# Patient Record
Sex: Male | Born: 1935 | Race: White | Hispanic: No | Smoking: Former smoker
Health system: Southern US, Community
[De-identification: ages and names within clinical notes are randomized; demographics above are authoritative.]

## PROBLEM LIST (undated history)

## (undated) DIAGNOSIS — M1990 Unspecified osteoarthritis, unspecified site: Secondary | ICD-10-CM

## (undated) HISTORY — PX: PROSTATE SURGERY: SHX751

## (undated) HISTORY — PX: EXPLORATORY LAPAROTOMY: SUR591

## (undated) HISTORY — PX: APPENDECTOMY: SHX54

## (undated) HISTORY — PX: LAMINECTOMY: SHX219

---

## 1994-12-22 DIAGNOSIS — C801 Malignant (primary) neoplasm, unspecified: Secondary | ICD-10-CM

## 1994-12-22 HISTORY — DX: Malignant (primary) neoplasm, unspecified: C80.1

## 2001-07-06 ENCOUNTER — Ambulatory Visit (HOSPITAL_COMMUNITY): Admission: RE | Admit: 2001-07-06 | Discharge: 2001-07-06 | Payer: Self-pay | Admitting: Ophthalmology

## 2004-10-22 ENCOUNTER — Ambulatory Visit (HOSPITAL_COMMUNITY): Admission: RE | Admit: 2004-10-22 | Discharge: 2004-10-22 | Payer: Self-pay | Admitting: Urology

## 2005-10-28 ENCOUNTER — Ambulatory Visit (HOSPITAL_COMMUNITY): Admission: RE | Admit: 2005-10-28 | Discharge: 2005-10-28 | Payer: Self-pay | Admitting: Urology

## 2006-10-01 ENCOUNTER — Ambulatory Visit (HOSPITAL_COMMUNITY): Admission: RE | Admit: 2006-10-01 | Discharge: 2006-10-01 | Payer: Self-pay | Admitting: Urology

## 2008-04-17 ENCOUNTER — Ambulatory Visit: Payer: Self-pay | Admitting: Internal Medicine

## 2008-04-26 ENCOUNTER — Encounter: Payer: Self-pay | Admitting: Internal Medicine

## 2008-04-26 ENCOUNTER — Ambulatory Visit: Payer: Self-pay | Admitting: Internal Medicine

## 2008-05-10 ENCOUNTER — Ambulatory Visit: Payer: Self-pay | Admitting: Internal Medicine

## 2011-01-11 ENCOUNTER — Encounter: Payer: Self-pay | Admitting: Urology

## 2012-04-27 ENCOUNTER — Ambulatory Visit (INDEPENDENT_AMBULATORY_CARE_PROVIDER_SITE_OTHER): Payer: Medicare Other | Admitting: Urology

## 2012-04-27 DIAGNOSIS — C61 Malignant neoplasm of prostate: Secondary | ICD-10-CM

## 2012-04-27 DIAGNOSIS — N393 Stress incontinence (female) (male): Secondary | ICD-10-CM

## 2012-11-16 ENCOUNTER — Ambulatory Visit (INDEPENDENT_AMBULATORY_CARE_PROVIDER_SITE_OTHER): Payer: Medicare Other | Admitting: Urology

## 2012-11-16 DIAGNOSIS — C61 Malignant neoplasm of prostate: Secondary | ICD-10-CM

## 2012-11-16 DIAGNOSIS — N393 Stress incontinence (female) (male): Secondary | ICD-10-CM

## 2013-05-05 ENCOUNTER — Encounter: Payer: Self-pay | Admitting: Internal Medicine

## 2013-05-17 ENCOUNTER — Ambulatory Visit (INDEPENDENT_AMBULATORY_CARE_PROVIDER_SITE_OTHER): Payer: Medicare Other | Admitting: Urology

## 2013-05-17 DIAGNOSIS — N393 Stress incontinence (female) (male): Secondary | ICD-10-CM

## 2013-05-17 DIAGNOSIS — C61 Malignant neoplasm of prostate: Secondary | ICD-10-CM

## 2013-07-05 ENCOUNTER — Ambulatory Visit (INDEPENDENT_AMBULATORY_CARE_PROVIDER_SITE_OTHER): Payer: Self-pay | Admitting: Urology

## 2013-07-05 DIAGNOSIS — N3946 Mixed incontinence: Secondary | ICD-10-CM

## 2013-07-05 DIAGNOSIS — C61 Malignant neoplasm of prostate: Secondary | ICD-10-CM

## 2013-09-06 ENCOUNTER — Ambulatory Visit (INDEPENDENT_AMBULATORY_CARE_PROVIDER_SITE_OTHER): Payer: Medicare Other | Admitting: Urology

## 2013-09-06 DIAGNOSIS — C61 Malignant neoplasm of prostate: Secondary | ICD-10-CM

## 2013-09-06 DIAGNOSIS — N393 Stress incontinence (female) (male): Secondary | ICD-10-CM

## 2014-01-10 ENCOUNTER — Ambulatory Visit (INDEPENDENT_AMBULATORY_CARE_PROVIDER_SITE_OTHER): Payer: Medicare HMO | Admitting: Urology

## 2014-01-10 DIAGNOSIS — C61 Malignant neoplasm of prostate: Secondary | ICD-10-CM

## 2014-01-10 DIAGNOSIS — N393 Stress incontinence (female) (male): Secondary | ICD-10-CM

## 2014-05-16 ENCOUNTER — Ambulatory Visit (INDEPENDENT_AMBULATORY_CARE_PROVIDER_SITE_OTHER): Payer: Medicare HMO | Admitting: Urology

## 2014-05-16 ENCOUNTER — Other Ambulatory Visit: Payer: Self-pay | Admitting: Urology

## 2014-05-16 DIAGNOSIS — N393 Stress incontinence (female) (male): Secondary | ICD-10-CM

## 2014-05-16 DIAGNOSIS — C61 Malignant neoplasm of prostate: Secondary | ICD-10-CM

## 2014-05-22 ENCOUNTER — Encounter (HOSPITAL_COMMUNITY): Payer: Self-pay

## 2014-05-22 ENCOUNTER — Encounter (HOSPITAL_COMMUNITY)
Admission: RE | Admit: 2014-05-22 | Discharge: 2014-05-22 | Disposition: A | Discharge status: Home / Self Care | Payer: Medicare HMO | Source: Ambulatory Visit | Attending: Urology | Admitting: Urology

## 2014-05-22 DIAGNOSIS — C61 Malignant neoplasm of prostate: Secondary | ICD-10-CM

## 2014-05-22 MED ORDER — TECHNETIUM TC 99M MEDRONATE IV KIT
25.0000 | PACK | Freq: Once | INTRAVENOUS | Status: AC | PRN
  Administered 2014-05-22: 25 via INTRAVENOUS

## 2014-05-31 ENCOUNTER — Encounter: Payer: Self-pay | Admitting: Internal Medicine

## 2014-08-29 ENCOUNTER — Ambulatory Visit (INDEPENDENT_AMBULATORY_CARE_PROVIDER_SITE_OTHER): Payer: Medicare HMO | Admitting: Urology

## 2014-08-29 DIAGNOSIS — N393 Stress incontinence (female) (male): Secondary | ICD-10-CM

## 2014-08-29 DIAGNOSIS — C61 Malignant neoplasm of prostate: Secondary | ICD-10-CM

## 2014-12-26 ENCOUNTER — Other Ambulatory Visit: Payer: Self-pay | Admitting: Urology

## 2014-12-26 ENCOUNTER — Ambulatory Visit (INDEPENDENT_AMBULATORY_CARE_PROVIDER_SITE_OTHER): Payer: Medicare HMO | Admitting: Urology

## 2014-12-26 DIAGNOSIS — C61 Malignant neoplasm of prostate: Secondary | ICD-10-CM

## 2015-04-30 ENCOUNTER — Encounter (HOSPITAL_COMMUNITY)
Admission: RE | Admit: 2015-04-30 | Discharge: 2015-04-30 | Disposition: A | Discharge status: Home / Self Care | Payer: Medicare HMO | Source: Ambulatory Visit | Attending: Urology | Admitting: Urology

## 2015-04-30 ENCOUNTER — Encounter (HOSPITAL_COMMUNITY): Payer: Self-pay

## 2015-04-30 ENCOUNTER — Ambulatory Visit (HOSPITAL_COMMUNITY): Admission: RE | Admit: 2015-04-30 | Payer: Medicare HMO | Source: Ambulatory Visit

## 2015-04-30 DIAGNOSIS — C61 Malignant neoplasm of prostate: Secondary | ICD-10-CM

## 2015-04-30 MED ORDER — TECHNETIUM TC 99M MEDRONATE IV KIT: 25.0000 | PACK | Freq: Once | INTRAVENOUS | Status: AC | PRN

## 2015-05-07 ENCOUNTER — Ambulatory Visit (HOSPITAL_COMMUNITY)
Admission: RE | Admit: 2015-05-07 | Discharge: 2015-05-07 | Disposition: A | Discharge status: Home / Self Care | Payer: Medicare HMO | Source: Ambulatory Visit | Attending: Urology | Admitting: Urology

## 2015-05-07 DIAGNOSIS — C61 Malignant neoplasm of prostate: Secondary | ICD-10-CM | POA: Insufficient documentation

## 2015-05-07 LAB — POCT I-STAT CREATININE: Creatinine, Ser: 0.9 mg/dL (ref 0.61–1.24)

## 2015-05-07 MED ORDER — IOHEXOL 300 MG/ML  SOLN
100.0000 mL | Freq: Once | INTRAMUSCULAR | Status: AC | PRN
  Administered 2015-05-07: 100 mL via INTRAVENOUS

## 2015-05-08 ENCOUNTER — Ambulatory Visit (INDEPENDENT_AMBULATORY_CARE_PROVIDER_SITE_OTHER): Payer: Medicare HMO | Admitting: Urology

## 2015-05-08 DIAGNOSIS — N393 Stress incontinence (female) (male): Secondary | ICD-10-CM | POA: Diagnosis not present

## 2015-05-08 DIAGNOSIS — C61 Malignant neoplasm of prostate: Secondary | ICD-10-CM | POA: Diagnosis not present

## 2015-09-11 ENCOUNTER — Ambulatory Visit (INDEPENDENT_AMBULATORY_CARE_PROVIDER_SITE_OTHER): Payer: Medicare HMO | Admitting: Urology

## 2015-09-11 DIAGNOSIS — C61 Malignant neoplasm of prostate: Secondary | ICD-10-CM

## 2016-01-02 DIAGNOSIS — C61 Malignant neoplasm of prostate: Secondary | ICD-10-CM | POA: Diagnosis not present

## 2016-01-08 ENCOUNTER — Ambulatory Visit (INDEPENDENT_AMBULATORY_CARE_PROVIDER_SITE_OTHER): Payer: PPO | Admitting: Urology

## 2016-01-08 DIAGNOSIS — N393 Stress incontinence (female) (male): Secondary | ICD-10-CM

## 2016-01-08 DIAGNOSIS — C61 Malignant neoplasm of prostate: Secondary | ICD-10-CM | POA: Diagnosis not present

## 2016-02-18 DIAGNOSIS — Z0000 Encounter for general adult medical examination without abnormal findings: Secondary | ICD-10-CM | POA: Diagnosis not present

## 2016-02-18 DIAGNOSIS — H6123 Impacted cerumen, bilateral: Secondary | ICD-10-CM | POA: Diagnosis not present

## 2016-02-18 DIAGNOSIS — Z1211 Encounter for screening for malignant neoplasm of colon: Secondary | ICD-10-CM | POA: Diagnosis not present

## 2016-02-18 DIAGNOSIS — Z1389 Encounter for screening for other disorder: Secondary | ICD-10-CM | POA: Diagnosis not present

## 2016-02-18 DIAGNOSIS — Z6831 Body mass index (BMI) 31.0-31.9, adult: Secondary | ICD-10-CM | POA: Diagnosis not present

## 2016-02-18 DIAGNOSIS — Z7189 Other specified counseling: Secondary | ICD-10-CM | POA: Diagnosis not present

## 2016-02-18 DIAGNOSIS — I10 Essential (primary) hypertension: Secondary | ICD-10-CM | POA: Diagnosis not present

## 2016-02-18 DIAGNOSIS — E7800 Pure hypercholesterolemia, unspecified: Secondary | ICD-10-CM | POA: Diagnosis not present

## 2016-02-18 DIAGNOSIS — Z418 Encounter for other procedures for purposes other than remedying health state: Secondary | ICD-10-CM | POA: Diagnosis not present

## 2016-02-18 DIAGNOSIS — R5383 Other fatigue: Secondary | ICD-10-CM | POA: Diagnosis not present

## 2016-03-01 IMAGING — CT CT ABD-PELV W/ CM
2 of 5 series · 15 of 46 positions shown, 17 images · IV contrast (Omnipaque 300)
Comparison: 04/30/2015 ; 10/22/2004

CLINICAL DATA: Prostate cancer

EXAM:
CT ABDOMEN AND PELVIS WITH CONTRAST
TECHNIQUE: Multidetector CT imaging of the abdomen and pelvis was performed
using the standard protocol following bolus administration of
intravenous contrast.
CONTRAST:  100mL OMNIPAQUE IOHEXOL 300 MG/ML  SOLN

[Series 2: abd_pel_with 5.0 b40f · axial · 0.72mm/px · z∈[-478,-28]mm · 12 of 102 slices shown, 14 images]
[im 6/102  soft-tissue]
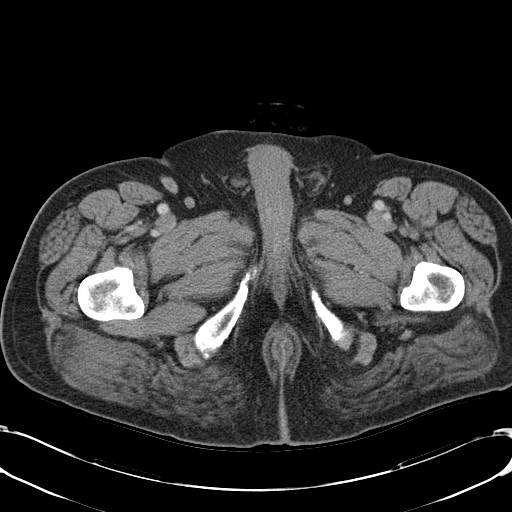
[im 6/102  bone]
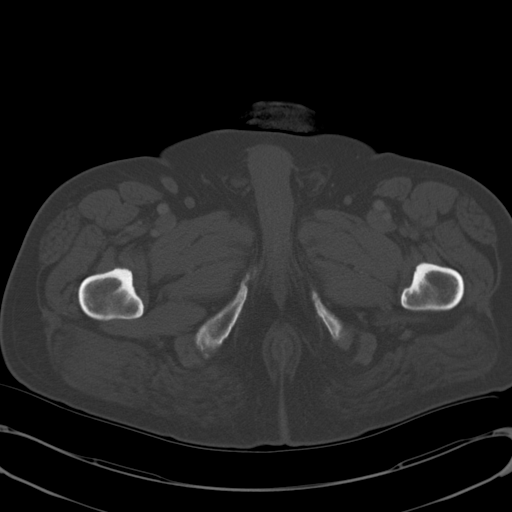
[im 18/102  soft-tissue]
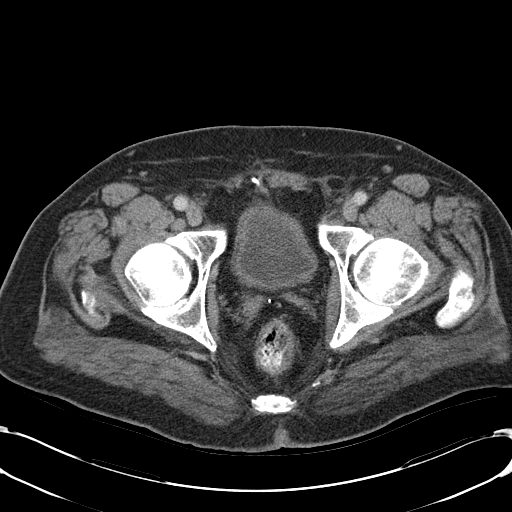
[im 24/102  soft-tissue]
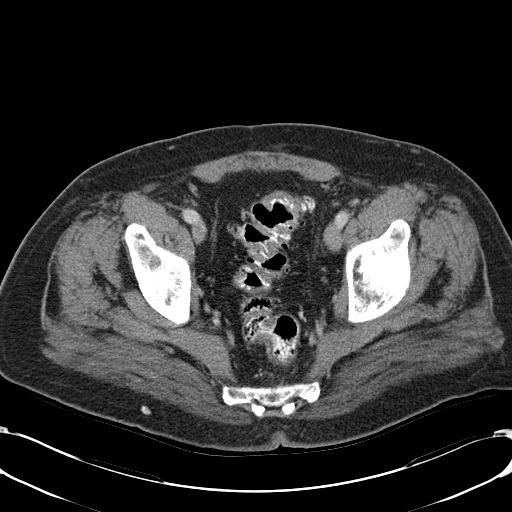
[im 30/102  soft-tissue]
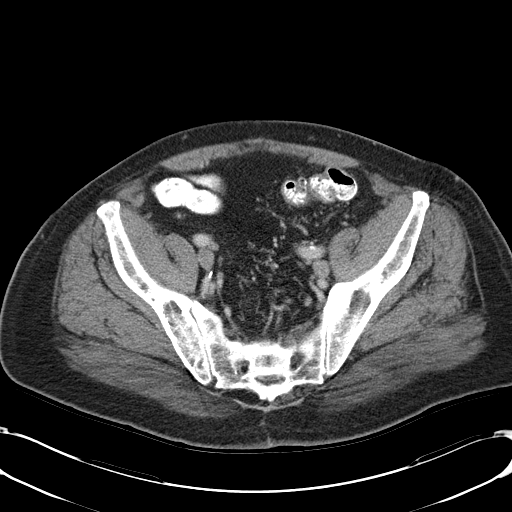
[im 42/102  soft-tissue]
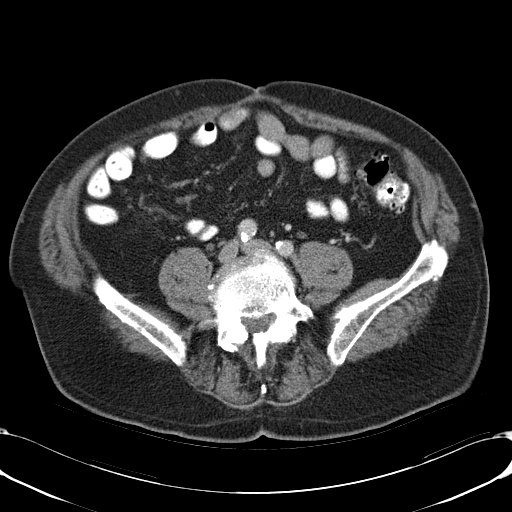
[im 48/102  soft-tissue]
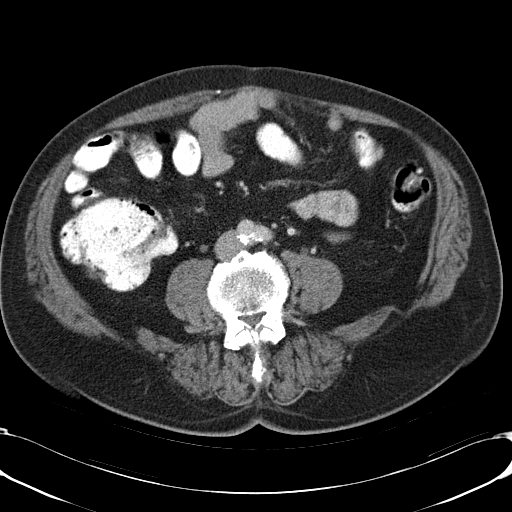
[im 54/102  soft-tissue]
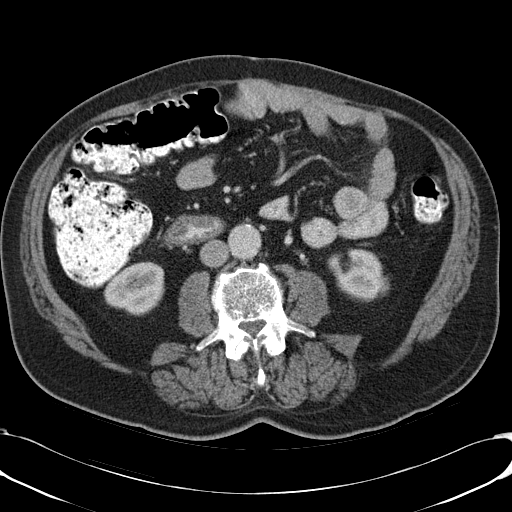
[im 66/102  soft-tissue]
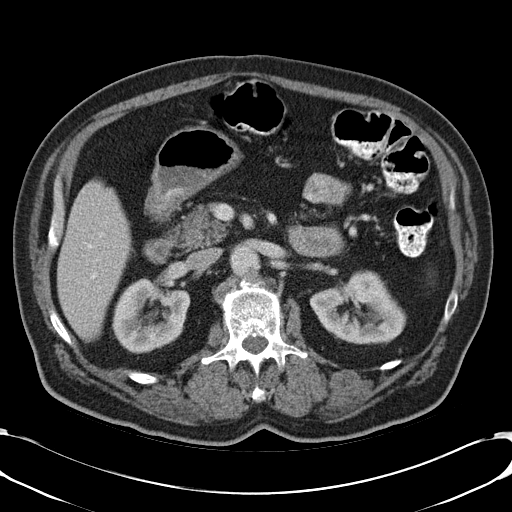
[im 72/102  soft-tissue]
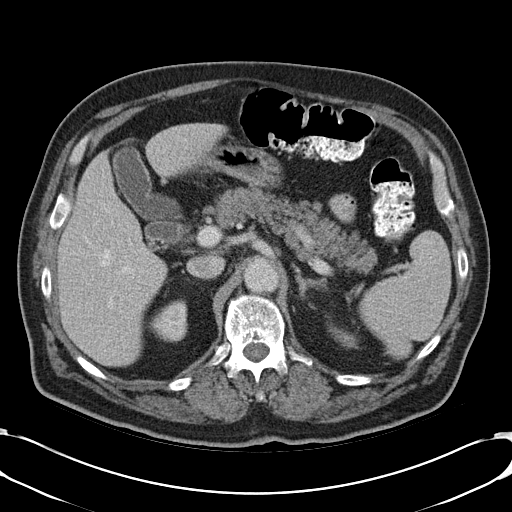
[im 72/102  bone]
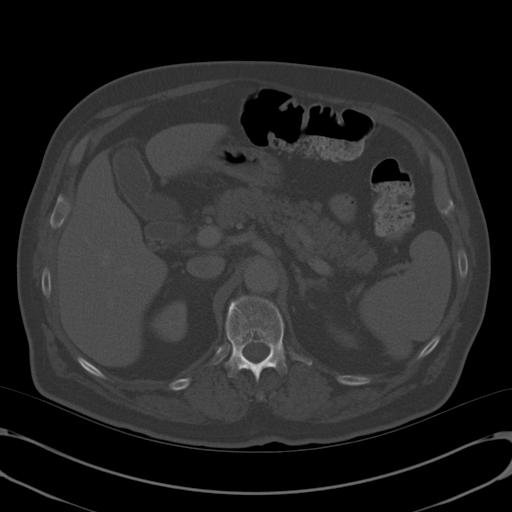
[im 78/102  soft-tissue]
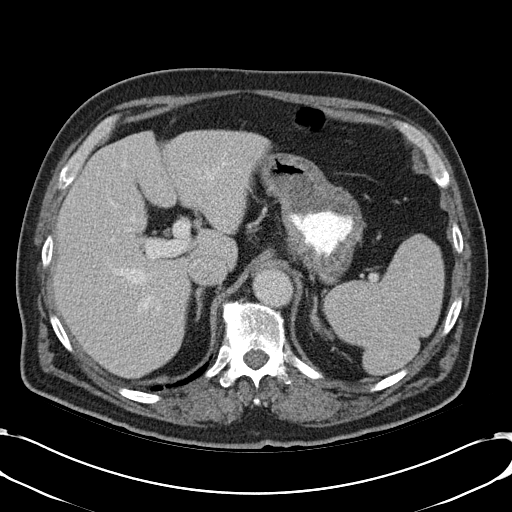
[im 90/102  soft-tissue]
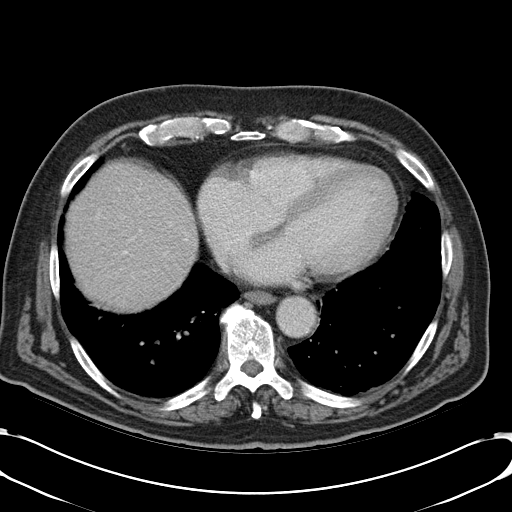
[im 96/102  soft-tissue]
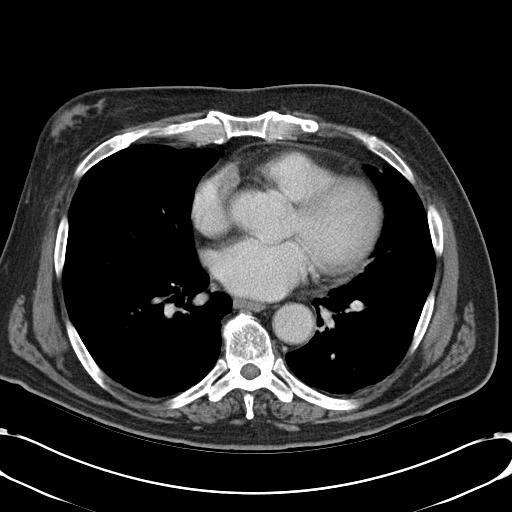

[Series 5: abd_pel_with 3.0 spo · coronal · 0.75mm/px · 3 of 93 slices shown]
[im 31/93  soft-tissue]
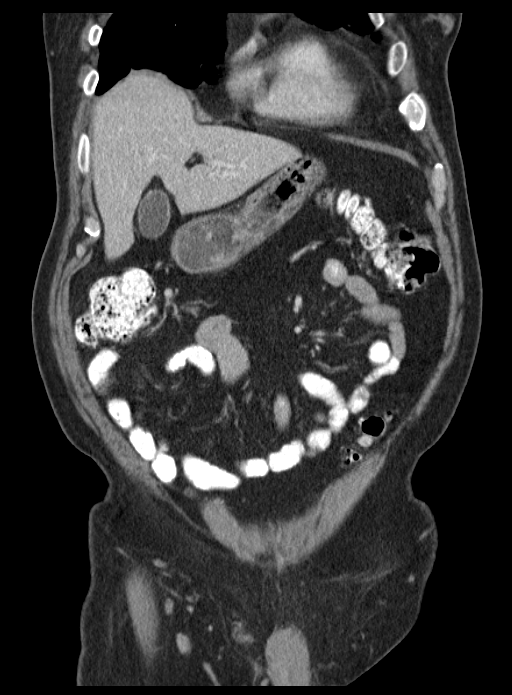
[im 41/93  soft-tissue]
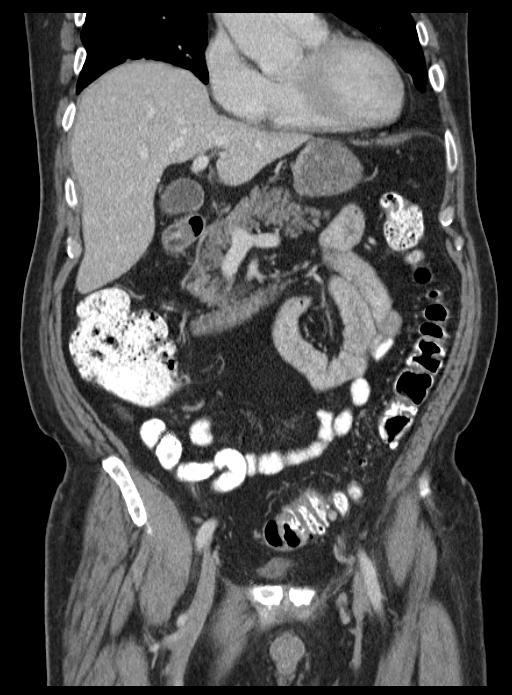
[im 52/93  soft-tissue]
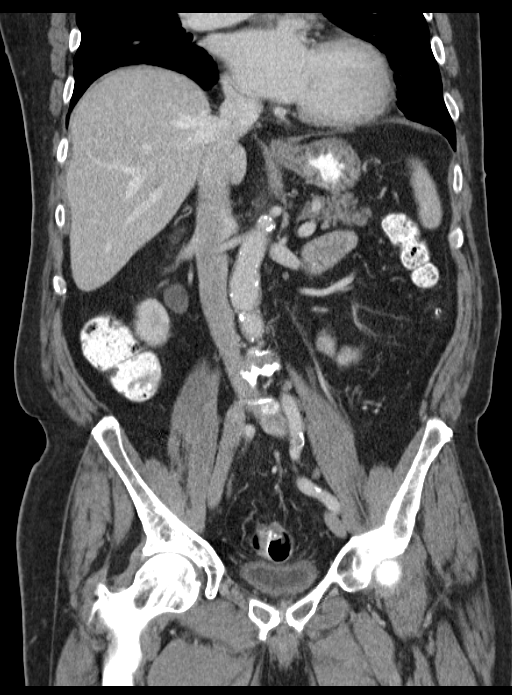

[15 of 46 positions shown; findings below may reference images not displayed]

FINDINGS: Lower chest: Mild atelectasis or scarring in the right middle lobe
and both lower lobes. Low-density structure below the left pulmonary
vein on image 1 series 2 is not technically specific of but could
represent a low-density lymph node. Mild enlargement of the
cardiopericardial silhouette noted.

Hepatobiliary: Unremarkable

Pancreas: Unremarkable

Spleen: Unremarkable

Adrenals/Urinary Tract: 1 cm partially fatty mass of the right
kidney lower pole, image 55 series 5, compatible with
angiomyolipoma. Left kidney lower pole scar versus 1.3 cm
angiomyolipoma.

Stomach/Bowel: Sigmoid colon diverticulosis.

Vascular/Lymphatic: Aortoiliac atherosclerotic vascular disease.
Mildly ectatic infrarenal abdominal aorta.

Left external iliac node 0.7 cm in short axis, stable. There is new
abnormal nodularity to the right of the prostate gland, measuring
1.5 by 2.2 cm on image 88 of series 2, not present on 10/22/2004.
There is also fullness along the right side of the prostatectomy
bed.

Reproductive: Fullness along the right side of the prostatectomy bed
as noted above.

Other: No supplemental non-categorized findings.

Musculoskeletal: Bridging spurring of both sacroiliac joints.
Postoperative findings centric to the right in the lower lumbar
spine. Lumbar spondylosis and degenerative disc disease causing
right foraminal impingement at L4-5 and L5-S1, and left foraminal
impingement at L4-5.
IMPRESSION: 1. Abnormal soft tissue fullness and nodularity along the right side
of the prostatectomy bed. Adjacent lymph node 1.5 by 2.2 cm.
Appearance concerning for local recurrence.
2. Partial imaging of a low-density structure which could represent
a lymph node below the left pulmonary vein, 1.3 cm in short axis.
3. 1 cm right renal angiomyolipoma. Possible 1.3 cm left renal
angiomyolipoma.
4. Sigmoid colon diverticulosis.
5.  Aortoiliac atherosclerotic vascular disease.
6. Lumbar spondylosis and degenerative disc disease cause
impingement at L4-5 at L5-S1.

## 2016-04-29 DIAGNOSIS — H401131 Primary open-angle glaucoma, bilateral, mild stage: Secondary | ICD-10-CM | POA: Diagnosis not present

## 2016-04-29 DIAGNOSIS — Z961 Presence of intraocular lens: Secondary | ICD-10-CM | POA: Diagnosis not present

## 2016-04-30 DIAGNOSIS — C61 Malignant neoplasm of prostate: Secondary | ICD-10-CM | POA: Diagnosis not present

## 2016-05-06 ENCOUNTER — Ambulatory Visit (INDEPENDENT_AMBULATORY_CARE_PROVIDER_SITE_OTHER): Payer: PPO | Admitting: Urology

## 2016-05-06 DIAGNOSIS — C61 Malignant neoplasm of prostate: Secondary | ICD-10-CM | POA: Diagnosis not present

## 2016-05-06 DIAGNOSIS — N390 Urinary tract infection, site not specified: Secondary | ICD-10-CM | POA: Diagnosis not present

## 2016-05-15 DIAGNOSIS — I10 Essential (primary) hypertension: Secondary | ICD-10-CM | POA: Diagnosis not present

## 2016-05-15 DIAGNOSIS — M1710 Unilateral primary osteoarthritis, unspecified knee: Secondary | ICD-10-CM | POA: Diagnosis not present

## 2016-05-28 DIAGNOSIS — Z87891 Personal history of nicotine dependence: Secondary | ICD-10-CM | POA: Diagnosis not present

## 2016-05-28 DIAGNOSIS — R350 Frequency of micturition: Secondary | ICD-10-CM | POA: Diagnosis not present

## 2016-06-30 DIAGNOSIS — I10 Essential (primary) hypertension: Secondary | ICD-10-CM | POA: Diagnosis not present

## 2016-06-30 DIAGNOSIS — Z6830 Body mass index (BMI) 30.0-30.9, adult: Secondary | ICD-10-CM | POA: Diagnosis not present

## 2016-06-30 DIAGNOSIS — M1710 Unilateral primary osteoarthritis, unspecified knee: Secondary | ICD-10-CM | POA: Diagnosis not present

## 2016-06-30 DIAGNOSIS — Z299 Encounter for prophylactic measures, unspecified: Secondary | ICD-10-CM | POA: Diagnosis not present

## 2016-06-30 DIAGNOSIS — Z713 Dietary counseling and surveillance: Secondary | ICD-10-CM | POA: Diagnosis not present

## 2016-07-07 DIAGNOSIS — M1712 Unilateral primary osteoarthritis, left knee: Secondary | ICD-10-CM | POA: Diagnosis not present

## 2016-07-07 DIAGNOSIS — M25561 Pain in right knee: Secondary | ICD-10-CM | POA: Diagnosis not present

## 2016-07-07 DIAGNOSIS — M25562 Pain in left knee: Secondary | ICD-10-CM | POA: Diagnosis not present

## 2016-07-07 DIAGNOSIS — M1711 Unilateral primary osteoarthritis, right knee: Secondary | ICD-10-CM | POA: Diagnosis not present

## 2016-08-18 DIAGNOSIS — E7800 Pure hypercholesterolemia, unspecified: Secondary | ICD-10-CM | POA: Diagnosis not present

## 2016-08-18 DIAGNOSIS — I10 Essential (primary) hypertension: Secondary | ICD-10-CM | POA: Diagnosis not present

## 2016-09-02 DIAGNOSIS — C61 Malignant neoplasm of prostate: Secondary | ICD-10-CM | POA: Diagnosis not present

## 2016-09-09 ENCOUNTER — Other Ambulatory Visit (HOSPITAL_COMMUNITY)
Admission: RE | Admit: 2016-09-09 | Discharge: 2016-09-09 | Disposition: A | Discharge status: Home / Self Care | Payer: PPO | Source: Ambulatory Visit | Attending: Internal Medicine | Admitting: Internal Medicine

## 2016-09-09 ENCOUNTER — Ambulatory Visit (INDEPENDENT_AMBULATORY_CARE_PROVIDER_SITE_OTHER): Payer: PPO | Admitting: Urology

## 2016-09-09 DIAGNOSIS — C61 Malignant neoplasm of prostate: Secondary | ICD-10-CM | POA: Diagnosis not present

## 2016-09-09 DIAGNOSIS — R9721 Rising PSA following treatment for malignant neoplasm of prostate: Secondary | ICD-10-CM | POA: Diagnosis not present

## 2016-09-09 DIAGNOSIS — R8271 Bacteriuria: Secondary | ICD-10-CM

## 2016-09-12 LAB — URINE CULTURE: Culture: 40000 — AB

## 2016-11-18 DIAGNOSIS — Z6830 Body mass index (BMI) 30.0-30.9, adult: Secondary | ICD-10-CM | POA: Diagnosis not present

## 2016-11-18 DIAGNOSIS — Z299 Encounter for prophylactic measures, unspecified: Secondary | ICD-10-CM | POA: Diagnosis not present

## 2016-11-18 DIAGNOSIS — M1710 Unilateral primary osteoarthritis, unspecified knee: Secondary | ICD-10-CM | POA: Diagnosis not present

## 2016-11-18 DIAGNOSIS — I10 Essential (primary) hypertension: Secondary | ICD-10-CM | POA: Diagnosis not present

## 2016-11-18 DIAGNOSIS — Z713 Dietary counseling and surveillance: Secondary | ICD-10-CM | POA: Diagnosis not present

## 2016-12-30 ENCOUNTER — Ambulatory Visit (INDEPENDENT_AMBULATORY_CARE_PROVIDER_SITE_OTHER): Payer: PPO | Admitting: Urology

## 2016-12-30 DIAGNOSIS — C61 Malignant neoplasm of prostate: Secondary | ICD-10-CM | POA: Diagnosis not present

## 2016-12-30 DIAGNOSIS — R9721 Rising PSA following treatment for malignant neoplasm of prostate: Secondary | ICD-10-CM | POA: Diagnosis not present

## 2016-12-30 DIAGNOSIS — N393 Stress incontinence (female) (male): Secondary | ICD-10-CM

## 2017-02-18 DIAGNOSIS — R5383 Other fatigue: Secondary | ICD-10-CM | POA: Diagnosis not present

## 2017-02-18 DIAGNOSIS — Z1211 Encounter for screening for malignant neoplasm of colon: Secondary | ICD-10-CM | POA: Diagnosis not present

## 2017-02-18 DIAGNOSIS — Z0000 Encounter for general adult medical examination without abnormal findings: Secondary | ICD-10-CM | POA: Diagnosis not present

## 2017-02-18 DIAGNOSIS — Z1389 Encounter for screening for other disorder: Secondary | ICD-10-CM | POA: Diagnosis not present

## 2017-02-18 DIAGNOSIS — Z79899 Other long term (current) drug therapy: Secondary | ICD-10-CM | POA: Diagnosis not present

## 2017-02-18 DIAGNOSIS — E669 Obesity, unspecified: Secondary | ICD-10-CM | POA: Diagnosis not present

## 2017-02-18 DIAGNOSIS — Z7189 Other specified counseling: Secondary | ICD-10-CM | POA: Diagnosis not present

## 2017-02-18 DIAGNOSIS — H409 Unspecified glaucoma: Secondary | ICD-10-CM | POA: Diagnosis not present

## 2017-02-18 DIAGNOSIS — E7800 Pure hypercholesterolemia, unspecified: Secondary | ICD-10-CM | POA: Diagnosis not present

## 2017-02-18 DIAGNOSIS — M109 Gout, unspecified: Secondary | ICD-10-CM | POA: Diagnosis not present

## 2017-02-18 DIAGNOSIS — Z6830 Body mass index (BMI) 30.0-30.9, adult: Secondary | ICD-10-CM | POA: Diagnosis not present

## 2017-02-18 DIAGNOSIS — I10 Essential (primary) hypertension: Secondary | ICD-10-CM | POA: Diagnosis not present

## 2017-02-18 DIAGNOSIS — Z299 Encounter for prophylactic measures, unspecified: Secondary | ICD-10-CM | POA: Diagnosis not present

## 2017-04-21 DIAGNOSIS — E669 Obesity, unspecified: Secondary | ICD-10-CM | POA: Diagnosis not present

## 2017-04-21 DIAGNOSIS — Z713 Dietary counseling and surveillance: Secondary | ICD-10-CM | POA: Diagnosis not present

## 2017-04-21 DIAGNOSIS — Z299 Encounter for prophylactic measures, unspecified: Secondary | ICD-10-CM | POA: Diagnosis not present

## 2017-04-21 DIAGNOSIS — I10 Essential (primary) hypertension: Secondary | ICD-10-CM | POA: Diagnosis not present

## 2017-04-21 DIAGNOSIS — M1710 Unilateral primary osteoarthritis, unspecified knee: Secondary | ICD-10-CM | POA: Diagnosis not present

## 2017-04-21 DIAGNOSIS — E7800 Pure hypercholesterolemia, unspecified: Secondary | ICD-10-CM | POA: Diagnosis not present

## 2017-04-21 DIAGNOSIS — Z87891 Personal history of nicotine dependence: Secondary | ICD-10-CM | POA: Diagnosis not present

## 2017-04-21 DIAGNOSIS — Z6829 Body mass index (BMI) 29.0-29.9, adult: Secondary | ICD-10-CM | POA: Diagnosis not present

## 2017-04-21 DIAGNOSIS — M109 Gout, unspecified: Secondary | ICD-10-CM | POA: Diagnosis not present

## 2017-04-30 DIAGNOSIS — Z7689 Persons encountering health services in other specified circumstances: Secondary | ICD-10-CM | POA: Diagnosis not present

## 2017-05-05 ENCOUNTER — Ambulatory Visit (INDEPENDENT_AMBULATORY_CARE_PROVIDER_SITE_OTHER): Payer: PPO | Admitting: Urology

## 2017-05-05 DIAGNOSIS — C61 Malignant neoplasm of prostate: Secondary | ICD-10-CM

## 2017-09-01 DIAGNOSIS — C61 Malignant neoplasm of prostate: Secondary | ICD-10-CM | POA: Diagnosis not present

## 2017-09-08 ENCOUNTER — Ambulatory Visit (INDEPENDENT_AMBULATORY_CARE_PROVIDER_SITE_OTHER): Payer: PPO | Admitting: Urology

## 2017-09-08 DIAGNOSIS — N393 Stress incontinence (female) (male): Secondary | ICD-10-CM | POA: Diagnosis not present

## 2017-09-08 DIAGNOSIS — C61 Malignant neoplasm of prostate: Secondary | ICD-10-CM

## 2017-09-08 DIAGNOSIS — R9721 Rising PSA following treatment for malignant neoplasm of prostate: Secondary | ICD-10-CM

## 2017-09-22 DIAGNOSIS — I10 Essential (primary) hypertension: Secondary | ICD-10-CM | POA: Diagnosis not present

## 2017-09-22 DIAGNOSIS — Z6829 Body mass index (BMI) 29.0-29.9, adult: Secondary | ICD-10-CM | POA: Diagnosis not present

## 2017-09-22 DIAGNOSIS — G5601 Carpal tunnel syndrome, right upper limb: Secondary | ICD-10-CM | POA: Diagnosis not present

## 2017-09-22 DIAGNOSIS — Z299 Encounter for prophylactic measures, unspecified: Secondary | ICD-10-CM | POA: Diagnosis not present

## 2017-09-22 DIAGNOSIS — E7800 Pure hypercholesterolemia, unspecified: Secondary | ICD-10-CM | POA: Diagnosis not present

## 2017-09-22 DIAGNOSIS — M1710 Unilateral primary osteoarthritis, unspecified knee: Secondary | ICD-10-CM | POA: Diagnosis not present

## 2018-01-08 DIAGNOSIS — Z6829 Body mass index (BMI) 29.0-29.9, adult: Secondary | ICD-10-CM | POA: Diagnosis not present

## 2018-01-08 DIAGNOSIS — Z87891 Personal history of nicotine dependence: Secondary | ICD-10-CM | POA: Diagnosis not present

## 2018-01-08 DIAGNOSIS — M1710 Unilateral primary osteoarthritis, unspecified knee: Secondary | ICD-10-CM | POA: Diagnosis not present

## 2018-01-08 DIAGNOSIS — Z299 Encounter for prophylactic measures, unspecified: Secondary | ICD-10-CM | POA: Diagnosis not present

## 2018-01-08 DIAGNOSIS — I10 Essential (primary) hypertension: Secondary | ICD-10-CM | POA: Diagnosis not present

## 2018-01-26 ENCOUNTER — Ambulatory Visit: Payer: PPO | Admitting: Urology

## 2018-01-26 DIAGNOSIS — C61 Malignant neoplasm of prostate: Secondary | ICD-10-CM

## 2018-01-26 DIAGNOSIS — N476 Balanoposthitis: Secondary | ICD-10-CM | POA: Diagnosis not present

## 2018-01-26 DIAGNOSIS — N393 Stress incontinence (female) (male): Secondary | ICD-10-CM | POA: Diagnosis not present

## 2018-02-26 DIAGNOSIS — I10 Essential (primary) hypertension: Secondary | ICD-10-CM | POA: Diagnosis not present

## 2018-02-26 DIAGNOSIS — Z79899 Other long term (current) drug therapy: Secondary | ICD-10-CM | POA: Diagnosis not present

## 2018-02-26 DIAGNOSIS — Z2821 Immunization not carried out because of patient refusal: Secondary | ICD-10-CM | POA: Diagnosis not present

## 2018-02-26 DIAGNOSIS — Z299 Encounter for prophylactic measures, unspecified: Secondary | ICD-10-CM | POA: Diagnosis not present

## 2018-02-26 DIAGNOSIS — Z1331 Encounter for screening for depression: Secondary | ICD-10-CM | POA: Diagnosis not present

## 2018-02-26 DIAGNOSIS — Z0000 Encounter for general adult medical examination without abnormal findings: Secondary | ICD-10-CM | POA: Diagnosis not present

## 2018-02-26 DIAGNOSIS — Z1211 Encounter for screening for malignant neoplasm of colon: Secondary | ICD-10-CM | POA: Diagnosis not present

## 2018-02-26 DIAGNOSIS — Z7189 Other specified counseling: Secondary | ICD-10-CM | POA: Diagnosis not present

## 2018-02-26 DIAGNOSIS — Z125 Encounter for screening for malignant neoplasm of prostate: Secondary | ICD-10-CM | POA: Diagnosis not present

## 2018-02-26 DIAGNOSIS — Z1339 Encounter for screening examination for other mental health and behavioral disorders: Secondary | ICD-10-CM | POA: Diagnosis not present

## 2018-02-26 DIAGNOSIS — M109 Gout, unspecified: Secondary | ICD-10-CM | POA: Diagnosis not present

## 2018-02-26 DIAGNOSIS — R5383 Other fatigue: Secondary | ICD-10-CM | POA: Diagnosis not present

## 2018-02-26 DIAGNOSIS — Z6829 Body mass index (BMI) 29.0-29.9, adult: Secondary | ICD-10-CM | POA: Diagnosis not present

## 2018-03-05 DIAGNOSIS — Z299 Encounter for prophylactic measures, unspecified: Secondary | ICD-10-CM | POA: Diagnosis not present

## 2018-03-05 DIAGNOSIS — I8390 Asymptomatic varicose veins of unspecified lower extremity: Secondary | ICD-10-CM | POA: Diagnosis not present

## 2018-03-05 DIAGNOSIS — Z6829 Body mass index (BMI) 29.0-29.9, adult: Secondary | ICD-10-CM | POA: Diagnosis not present

## 2018-03-05 DIAGNOSIS — E7800 Pure hypercholesterolemia, unspecified: Secondary | ICD-10-CM | POA: Diagnosis not present

## 2018-03-05 DIAGNOSIS — H6120 Impacted cerumen, unspecified ear: Secondary | ICD-10-CM | POA: Diagnosis not present

## 2018-03-05 DIAGNOSIS — M1710 Unilateral primary osteoarthritis, unspecified knee: Secondary | ICD-10-CM | POA: Diagnosis not present

## 2018-03-05 DIAGNOSIS — I10 Essential (primary) hypertension: Secondary | ICD-10-CM | POA: Diagnosis not present

## 2018-05-07 DIAGNOSIS — H401131 Primary open-angle glaucoma, bilateral, mild stage: Secondary | ICD-10-CM | POA: Diagnosis not present

## 2018-05-07 DIAGNOSIS — H26492 Other secondary cataract, left eye: Secondary | ICD-10-CM | POA: Diagnosis not present

## 2018-05-07 DIAGNOSIS — Z961 Presence of intraocular lens: Secondary | ICD-10-CM | POA: Diagnosis not present

## 2018-06-08 DIAGNOSIS — I10 Essential (primary) hypertension: Secondary | ICD-10-CM | POA: Diagnosis not present

## 2018-06-08 DIAGNOSIS — Z713 Dietary counseling and surveillance: Secondary | ICD-10-CM | POA: Diagnosis not present

## 2018-06-08 DIAGNOSIS — Z6829 Body mass index (BMI) 29.0-29.9, adult: Secondary | ICD-10-CM | POA: Diagnosis not present

## 2018-06-08 DIAGNOSIS — G5601 Carpal tunnel syndrome, right upper limb: Secondary | ICD-10-CM | POA: Diagnosis not present

## 2018-06-08 DIAGNOSIS — Z299 Encounter for prophylactic measures, unspecified: Secondary | ICD-10-CM | POA: Diagnosis not present

## 2018-07-22 DIAGNOSIS — Z713 Dietary counseling and surveillance: Secondary | ICD-10-CM | POA: Diagnosis not present

## 2018-07-22 DIAGNOSIS — I10 Essential (primary) hypertension: Secondary | ICD-10-CM | POA: Diagnosis not present

## 2018-07-22 DIAGNOSIS — L0390 Cellulitis, unspecified: Secondary | ICD-10-CM | POA: Diagnosis not present

## 2018-07-22 DIAGNOSIS — Z6829 Body mass index (BMI) 29.0-29.9, adult: Secondary | ICD-10-CM | POA: Diagnosis not present

## 2018-07-22 DIAGNOSIS — Z299 Encounter for prophylactic measures, unspecified: Secondary | ICD-10-CM | POA: Diagnosis not present

## 2018-08-09 DIAGNOSIS — Z299 Encounter for prophylactic measures, unspecified: Secondary | ICD-10-CM | POA: Diagnosis not present

## 2018-08-09 DIAGNOSIS — G5601 Carpal tunnel syndrome, right upper limb: Secondary | ICD-10-CM | POA: Diagnosis not present

## 2018-08-09 DIAGNOSIS — I10 Essential (primary) hypertension: Secondary | ICD-10-CM | POA: Diagnosis not present

## 2018-08-09 DIAGNOSIS — Z6829 Body mass index (BMI) 29.0-29.9, adult: Secondary | ICD-10-CM | POA: Diagnosis not present

## 2018-08-09 DIAGNOSIS — R2681 Unsteadiness on feet: Secondary | ICD-10-CM | POA: Diagnosis not present

## 2018-08-09 DIAGNOSIS — M1710 Unilateral primary osteoarthritis, unspecified knee: Secondary | ICD-10-CM | POA: Diagnosis not present

## 2018-08-17 ENCOUNTER — Ambulatory Visit: Payer: PPO | Admitting: Urology

## 2018-08-17 DIAGNOSIS — R9721 Rising PSA following treatment for malignant neoplasm of prostate: Secondary | ICD-10-CM | POA: Diagnosis not present

## 2018-08-17 DIAGNOSIS — C61 Malignant neoplasm of prostate: Secondary | ICD-10-CM

## 2018-08-17 DIAGNOSIS — N35013 Post-traumatic anterior urethral stricture: Secondary | ICD-10-CM

## 2018-08-25 DIAGNOSIS — Z6828 Body mass index (BMI) 28.0-28.9, adult: Secondary | ICD-10-CM | POA: Diagnosis not present

## 2018-08-25 DIAGNOSIS — Z299 Encounter for prophylactic measures, unspecified: Secondary | ICD-10-CM | POA: Diagnosis not present

## 2018-08-25 DIAGNOSIS — M1711 Unilateral primary osteoarthritis, right knee: Secondary | ICD-10-CM | POA: Diagnosis not present

## 2018-08-25 DIAGNOSIS — I10 Essential (primary) hypertension: Secondary | ICD-10-CM | POA: Diagnosis not present

## 2018-09-08 DIAGNOSIS — M1710 Unilateral primary osteoarthritis, unspecified knee: Secondary | ICD-10-CM | POA: Diagnosis not present

## 2018-09-08 DIAGNOSIS — Z6829 Body mass index (BMI) 29.0-29.9, adult: Secondary | ICD-10-CM | POA: Diagnosis not present

## 2018-09-08 DIAGNOSIS — I10 Essential (primary) hypertension: Secondary | ICD-10-CM | POA: Diagnosis not present

## 2018-09-08 DIAGNOSIS — Z299 Encounter for prophylactic measures, unspecified: Secondary | ICD-10-CM | POA: Diagnosis not present

## 2018-09-08 DIAGNOSIS — Z713 Dietary counseling and surveillance: Secondary | ICD-10-CM | POA: Diagnosis not present

## 2018-09-27 DIAGNOSIS — Z299 Encounter for prophylactic measures, unspecified: Secondary | ICD-10-CM | POA: Diagnosis not present

## 2018-09-27 DIAGNOSIS — R350 Frequency of micturition: Secondary | ICD-10-CM | POA: Diagnosis not present

## 2018-09-27 DIAGNOSIS — K130 Diseases of lips: Secondary | ICD-10-CM | POA: Diagnosis not present

## 2018-09-27 DIAGNOSIS — I10 Essential (primary) hypertension: Secondary | ICD-10-CM | POA: Diagnosis not present

## 2018-09-27 DIAGNOSIS — Z6829 Body mass index (BMI) 29.0-29.9, adult: Secondary | ICD-10-CM | POA: Diagnosis not present

## 2018-11-05 DIAGNOSIS — Z299 Encounter for prophylactic measures, unspecified: Secondary | ICD-10-CM | POA: Diagnosis not present

## 2018-11-05 DIAGNOSIS — Z6829 Body mass index (BMI) 29.0-29.9, adult: Secondary | ICD-10-CM | POA: Diagnosis not present

## 2018-11-05 DIAGNOSIS — Z2821 Immunization not carried out because of patient refusal: Secondary | ICD-10-CM | POA: Diagnosis not present

## 2018-11-05 DIAGNOSIS — M1710 Unilateral primary osteoarthritis, unspecified knee: Secondary | ICD-10-CM | POA: Diagnosis not present

## 2018-11-05 DIAGNOSIS — E7800 Pure hypercholesterolemia, unspecified: Secondary | ICD-10-CM | POA: Diagnosis not present

## 2018-11-05 DIAGNOSIS — I10 Essential (primary) hypertension: Secondary | ICD-10-CM | POA: Diagnosis not present

## 2018-11-29 DIAGNOSIS — M1712 Unilateral primary osteoarthritis, left knee: Secondary | ICD-10-CM | POA: Diagnosis not present

## 2018-11-29 DIAGNOSIS — Z299 Encounter for prophylactic measures, unspecified: Secondary | ICD-10-CM | POA: Diagnosis not present

## 2018-11-29 DIAGNOSIS — Z6829 Body mass index (BMI) 29.0-29.9, adult: Secondary | ICD-10-CM | POA: Diagnosis not present

## 2018-11-29 DIAGNOSIS — E7800 Pure hypercholesterolemia, unspecified: Secondary | ICD-10-CM | POA: Diagnosis not present

## 2018-11-29 DIAGNOSIS — I10 Essential (primary) hypertension: Secondary | ICD-10-CM | POA: Diagnosis not present

## 2018-12-17 DIAGNOSIS — I10 Essential (primary) hypertension: Secondary | ICD-10-CM | POA: Diagnosis not present

## 2018-12-17 DIAGNOSIS — Z299 Encounter for prophylactic measures, unspecified: Secondary | ICD-10-CM | POA: Diagnosis not present

## 2018-12-17 DIAGNOSIS — Z6829 Body mass index (BMI) 29.0-29.9, adult: Secondary | ICD-10-CM | POA: Diagnosis not present

## 2018-12-17 DIAGNOSIS — M170 Bilateral primary osteoarthritis of knee: Secondary | ICD-10-CM | POA: Diagnosis not present

## 2018-12-17 DIAGNOSIS — M1711 Unilateral primary osteoarthritis, right knee: Secondary | ICD-10-CM | POA: Diagnosis not present

## 2018-12-23 DIAGNOSIS — Z6829 Body mass index (BMI) 29.0-29.9, adult: Secondary | ICD-10-CM | POA: Diagnosis not present

## 2018-12-23 DIAGNOSIS — Z299 Encounter for prophylactic measures, unspecified: Secondary | ICD-10-CM | POA: Diagnosis not present

## 2018-12-23 DIAGNOSIS — M1711 Unilateral primary osteoarthritis, right knee: Secondary | ICD-10-CM | POA: Diagnosis not present

## 2018-12-23 DIAGNOSIS — I10 Essential (primary) hypertension: Secondary | ICD-10-CM | POA: Diagnosis not present

## 2019-01-13 DIAGNOSIS — Z6829 Body mass index (BMI) 29.0-29.9, adult: Secondary | ICD-10-CM | POA: Diagnosis not present

## 2019-01-13 DIAGNOSIS — I10 Essential (primary) hypertension: Secondary | ICD-10-CM | POA: Diagnosis not present

## 2019-01-13 DIAGNOSIS — Z299 Encounter for prophylactic measures, unspecified: Secondary | ICD-10-CM | POA: Diagnosis not present

## 2019-01-13 DIAGNOSIS — M170 Bilateral primary osteoarthritis of knee: Secondary | ICD-10-CM | POA: Diagnosis not present

## 2019-01-13 DIAGNOSIS — M1712 Unilateral primary osteoarthritis, left knee: Secondary | ICD-10-CM | POA: Diagnosis not present

## 2019-01-14 DIAGNOSIS — Z299 Encounter for prophylactic measures, unspecified: Secondary | ICD-10-CM | POA: Diagnosis not present

## 2019-01-14 DIAGNOSIS — Z6829 Body mass index (BMI) 29.0-29.9, adult: Secondary | ICD-10-CM | POA: Diagnosis not present

## 2019-01-14 DIAGNOSIS — M1711 Unilateral primary osteoarthritis, right knee: Secondary | ICD-10-CM | POA: Diagnosis not present

## 2019-01-14 DIAGNOSIS — M25561 Pain in right knee: Secondary | ICD-10-CM | POA: Diagnosis not present

## 2019-01-14 DIAGNOSIS — I10 Essential (primary) hypertension: Secondary | ICD-10-CM | POA: Diagnosis not present

## 2019-01-14 DIAGNOSIS — M170 Bilateral primary osteoarthritis of knee: Secondary | ICD-10-CM | POA: Diagnosis not present

## 2019-01-18 DIAGNOSIS — M170 Bilateral primary osteoarthritis of knee: Secondary | ICD-10-CM | POA: Diagnosis not present

## 2019-01-18 DIAGNOSIS — M1710 Unilateral primary osteoarthritis, unspecified knee: Secondary | ICD-10-CM | POA: Diagnosis not present

## 2019-01-18 DIAGNOSIS — Z6829 Body mass index (BMI) 29.0-29.9, adult: Secondary | ICD-10-CM | POA: Diagnosis not present

## 2019-01-18 DIAGNOSIS — I10 Essential (primary) hypertension: Secondary | ICD-10-CM | POA: Diagnosis not present

## 2019-01-18 DIAGNOSIS — M1712 Unilateral primary osteoarthritis, left knee: Secondary | ICD-10-CM | POA: Diagnosis not present

## 2019-01-18 DIAGNOSIS — Z299 Encounter for prophylactic measures, unspecified: Secondary | ICD-10-CM | POA: Diagnosis not present

## 2019-01-20 DIAGNOSIS — I10 Essential (primary) hypertension: Secondary | ICD-10-CM | POA: Diagnosis not present

## 2019-01-20 DIAGNOSIS — M1712 Unilateral primary osteoarthritis, left knee: Secondary | ICD-10-CM | POA: Diagnosis not present

## 2019-01-20 DIAGNOSIS — Z299 Encounter for prophylactic measures, unspecified: Secondary | ICD-10-CM | POA: Diagnosis not present

## 2019-01-20 DIAGNOSIS — Z6829 Body mass index (BMI) 29.0-29.9, adult: Secondary | ICD-10-CM | POA: Diagnosis not present

## 2019-01-20 DIAGNOSIS — M1711 Unilateral primary osteoarthritis, right knee: Secondary | ICD-10-CM | POA: Diagnosis not present

## 2019-01-25 DIAGNOSIS — R269 Unspecified abnormalities of gait and mobility: Secondary | ICD-10-CM | POA: Diagnosis not present

## 2019-01-25 DIAGNOSIS — M2560 Stiffness of unspecified joint, not elsewhere classified: Secondary | ICD-10-CM | POA: Diagnosis not present

## 2019-01-25 DIAGNOSIS — Z299 Encounter for prophylactic measures, unspecified: Secondary | ICD-10-CM | POA: Diagnosis not present

## 2019-01-25 DIAGNOSIS — R262 Difficulty in walking, not elsewhere classified: Secondary | ICD-10-CM | POA: Diagnosis not present

## 2019-01-25 DIAGNOSIS — Z6829 Body mass index (BMI) 29.0-29.9, adult: Secondary | ICD-10-CM | POA: Diagnosis not present

## 2019-01-25 DIAGNOSIS — M1712 Unilateral primary osteoarthritis, left knee: Secondary | ICD-10-CM | POA: Diagnosis not present

## 2019-01-25 DIAGNOSIS — I10 Essential (primary) hypertension: Secondary | ICD-10-CM | POA: Diagnosis not present

## 2019-01-27 DIAGNOSIS — Z299 Encounter for prophylactic measures, unspecified: Secondary | ICD-10-CM | POA: Diagnosis not present

## 2019-01-27 DIAGNOSIS — I10 Essential (primary) hypertension: Secondary | ICD-10-CM | POA: Diagnosis not present

## 2019-01-27 DIAGNOSIS — M1710 Unilateral primary osteoarthritis, unspecified knee: Secondary | ICD-10-CM | POA: Diagnosis not present

## 2019-01-27 DIAGNOSIS — Z6829 Body mass index (BMI) 29.0-29.9, adult: Secondary | ICD-10-CM | POA: Diagnosis not present

## 2019-01-27 DIAGNOSIS — M1711 Unilateral primary osteoarthritis, right knee: Secondary | ICD-10-CM | POA: Diagnosis not present

## 2019-02-01 DIAGNOSIS — I10 Essential (primary) hypertension: Secondary | ICD-10-CM | POA: Diagnosis not present

## 2019-02-01 DIAGNOSIS — M1712 Unilateral primary osteoarthritis, left knee: Secondary | ICD-10-CM | POA: Diagnosis not present

## 2019-02-01 DIAGNOSIS — Z299 Encounter for prophylactic measures, unspecified: Secondary | ICD-10-CM | POA: Diagnosis not present

## 2019-02-01 DIAGNOSIS — Z6829 Body mass index (BMI) 29.0-29.9, adult: Secondary | ICD-10-CM | POA: Diagnosis not present

## 2019-02-03 DIAGNOSIS — M1712 Unilateral primary osteoarthritis, left knee: Secondary | ICD-10-CM | POA: Diagnosis not present

## 2019-02-03 DIAGNOSIS — Z299 Encounter for prophylactic measures, unspecified: Secondary | ICD-10-CM | POA: Diagnosis not present

## 2019-02-03 DIAGNOSIS — M1711 Unilateral primary osteoarthritis, right knee: Secondary | ICD-10-CM | POA: Diagnosis not present

## 2019-02-03 DIAGNOSIS — Z6829 Body mass index (BMI) 29.0-29.9, adult: Secondary | ICD-10-CM | POA: Diagnosis not present

## 2019-02-03 DIAGNOSIS — I10 Essential (primary) hypertension: Secondary | ICD-10-CM | POA: Diagnosis not present

## 2019-02-08 DIAGNOSIS — I10 Essential (primary) hypertension: Secondary | ICD-10-CM | POA: Diagnosis not present

## 2019-02-08 DIAGNOSIS — Z299 Encounter for prophylactic measures, unspecified: Secondary | ICD-10-CM | POA: Diagnosis not present

## 2019-02-08 DIAGNOSIS — M1711 Unilateral primary osteoarthritis, right knee: Secondary | ICD-10-CM | POA: Diagnosis not present

## 2019-02-08 DIAGNOSIS — Z6829 Body mass index (BMI) 29.0-29.9, adult: Secondary | ICD-10-CM | POA: Diagnosis not present

## 2019-02-10 DIAGNOSIS — M1712 Unilateral primary osteoarthritis, left knee: Secondary | ICD-10-CM | POA: Diagnosis not present

## 2019-02-10 DIAGNOSIS — Z299 Encounter for prophylactic measures, unspecified: Secondary | ICD-10-CM | POA: Diagnosis not present

## 2019-02-10 DIAGNOSIS — I10 Essential (primary) hypertension: Secondary | ICD-10-CM | POA: Diagnosis not present

## 2019-02-10 DIAGNOSIS — Z6829 Body mass index (BMI) 29.0-29.9, adult: Secondary | ICD-10-CM | POA: Diagnosis not present

## 2019-02-15 DIAGNOSIS — I10 Essential (primary) hypertension: Secondary | ICD-10-CM | POA: Diagnosis not present

## 2019-02-15 DIAGNOSIS — Z299 Encounter for prophylactic measures, unspecified: Secondary | ICD-10-CM | POA: Diagnosis not present

## 2019-02-15 DIAGNOSIS — M1711 Unilateral primary osteoarthritis, right knee: Secondary | ICD-10-CM | POA: Diagnosis not present

## 2019-02-15 DIAGNOSIS — Z6829 Body mass index (BMI) 29.0-29.9, adult: Secondary | ICD-10-CM | POA: Diagnosis not present

## 2019-02-17 DIAGNOSIS — Z6829 Body mass index (BMI) 29.0-29.9, adult: Secondary | ICD-10-CM | POA: Diagnosis not present

## 2019-02-17 DIAGNOSIS — M1712 Unilateral primary osteoarthritis, left knee: Secondary | ICD-10-CM | POA: Diagnosis not present

## 2019-02-17 DIAGNOSIS — I10 Essential (primary) hypertension: Secondary | ICD-10-CM | POA: Diagnosis not present

## 2019-02-17 DIAGNOSIS — M109 Gout, unspecified: Secondary | ICD-10-CM | POA: Diagnosis not present

## 2019-02-17 DIAGNOSIS — Z299 Encounter for prophylactic measures, unspecified: Secondary | ICD-10-CM | POA: Diagnosis not present

## 2019-02-17 DIAGNOSIS — M170 Bilateral primary osteoarthritis of knee: Secondary | ICD-10-CM | POA: Diagnosis not present

## 2019-02-28 DIAGNOSIS — E7800 Pure hypercholesterolemia, unspecified: Secondary | ICD-10-CM | POA: Diagnosis not present

## 2019-02-28 DIAGNOSIS — Z87891 Personal history of nicotine dependence: Secondary | ICD-10-CM | POA: Diagnosis not present

## 2019-02-28 DIAGNOSIS — I10 Essential (primary) hypertension: Secondary | ICD-10-CM | POA: Diagnosis not present

## 2019-02-28 DIAGNOSIS — Z299 Encounter for prophylactic measures, unspecified: Secondary | ICD-10-CM | POA: Diagnosis not present

## 2019-02-28 DIAGNOSIS — Z6829 Body mass index (BMI) 29.0-29.9, adult: Secondary | ICD-10-CM | POA: Diagnosis not present

## 2019-03-01 ENCOUNTER — Ambulatory Visit: Payer: PPO | Admitting: Urology

## 2019-03-01 DIAGNOSIS — R9721 Rising PSA following treatment for malignant neoplasm of prostate: Secondary | ICD-10-CM

## 2019-03-01 DIAGNOSIS — C61 Malignant neoplasm of prostate: Secondary | ICD-10-CM

## 2019-03-01 DIAGNOSIS — N393 Stress incontinence (female) (male): Secondary | ICD-10-CM

## 2019-07-20 DIAGNOSIS — Z125 Encounter for screening for malignant neoplasm of prostate: Secondary | ICD-10-CM | POA: Diagnosis not present

## 2019-07-20 DIAGNOSIS — Z6829 Body mass index (BMI) 29.0-29.9, adult: Secondary | ICD-10-CM | POA: Diagnosis not present

## 2019-07-20 DIAGNOSIS — Z7189 Other specified counseling: Secondary | ICD-10-CM | POA: Diagnosis not present

## 2019-07-20 DIAGNOSIS — R5383 Other fatigue: Secondary | ICD-10-CM | POA: Diagnosis not present

## 2019-07-20 DIAGNOSIS — E7800 Pure hypercholesterolemia, unspecified: Secondary | ICD-10-CM | POA: Diagnosis not present

## 2019-07-20 DIAGNOSIS — Z0000 Encounter for general adult medical examination without abnormal findings: Secondary | ICD-10-CM | POA: Diagnosis not present

## 2019-07-20 DIAGNOSIS — I10 Essential (primary) hypertension: Secondary | ICD-10-CM | POA: Diagnosis not present

## 2019-07-20 DIAGNOSIS — Z1339 Encounter for screening examination for other mental health and behavioral disorders: Secondary | ICD-10-CM | POA: Diagnosis not present

## 2019-07-20 DIAGNOSIS — Z79899 Other long term (current) drug therapy: Secondary | ICD-10-CM | POA: Diagnosis not present

## 2019-07-20 DIAGNOSIS — Z299 Encounter for prophylactic measures, unspecified: Secondary | ICD-10-CM | POA: Diagnosis not present

## 2019-07-20 DIAGNOSIS — Z1211 Encounter for screening for malignant neoplasm of colon: Secondary | ICD-10-CM | POA: Diagnosis not present

## 2019-07-20 DIAGNOSIS — Z1331 Encounter for screening for depression: Secondary | ICD-10-CM | POA: Diagnosis not present

## 2019-09-13 ENCOUNTER — Ambulatory Visit (INDEPENDENT_AMBULATORY_CARE_PROVIDER_SITE_OTHER): Payer: PPO | Admitting: Urology

## 2019-09-13 DIAGNOSIS — C61 Malignant neoplasm of prostate: Secondary | ICD-10-CM

## 2019-09-13 DIAGNOSIS — R9721 Rising PSA following treatment for malignant neoplasm of prostate: Secondary | ICD-10-CM

## 2019-09-13 DIAGNOSIS — N35013 Post-traumatic anterior urethral stricture: Secondary | ICD-10-CM

## 2019-09-13 DIAGNOSIS — R3914 Feeling of incomplete bladder emptying: Secondary | ICD-10-CM

## 2019-09-13 DIAGNOSIS — N393 Stress incontinence (female) (male): Secondary | ICD-10-CM

## 2019-10-21 DIAGNOSIS — Z6829 Body mass index (BMI) 29.0-29.9, adult: Secondary | ICD-10-CM | POA: Diagnosis not present

## 2019-10-21 DIAGNOSIS — I10 Essential (primary) hypertension: Secondary | ICD-10-CM | POA: Diagnosis not present

## 2019-10-21 DIAGNOSIS — Z299 Encounter for prophylactic measures, unspecified: Secondary | ICD-10-CM | POA: Diagnosis not present

## 2019-10-21 DIAGNOSIS — M1710 Unilateral primary osteoarthritis, unspecified knee: Secondary | ICD-10-CM | POA: Diagnosis not present

## 2019-10-21 DIAGNOSIS — M1712 Unilateral primary osteoarthritis, left knee: Secondary | ICD-10-CM | POA: Diagnosis not present

## 2019-10-21 DIAGNOSIS — E7800 Pure hypercholesterolemia, unspecified: Secondary | ICD-10-CM | POA: Diagnosis not present

## 2020-01-10 DIAGNOSIS — Z299 Encounter for prophylactic measures, unspecified: Secondary | ICD-10-CM | POA: Diagnosis not present

## 2020-01-10 DIAGNOSIS — R350 Frequency of micturition: Secondary | ICD-10-CM | POA: Diagnosis not present

## 2020-01-10 DIAGNOSIS — Z87891 Personal history of nicotine dependence: Secondary | ICD-10-CM | POA: Diagnosis not present

## 2020-01-10 DIAGNOSIS — I10 Essential (primary) hypertension: Secondary | ICD-10-CM | POA: Diagnosis not present

## 2020-01-10 DIAGNOSIS — Z6829 Body mass index (BMI) 29.0-29.9, adult: Secondary | ICD-10-CM | POA: Diagnosis not present

## 2020-01-10 DIAGNOSIS — Z2821 Immunization not carried out because of patient refusal: Secondary | ICD-10-CM | POA: Diagnosis not present

## 2020-01-10 DIAGNOSIS — N390 Urinary tract infection, site not specified: Secondary | ICD-10-CM | POA: Diagnosis not present

## 2020-01-31 DIAGNOSIS — R531 Weakness: Secondary | ICD-10-CM | POA: Diagnosis not present

## 2020-01-31 DIAGNOSIS — Z299 Encounter for prophylactic measures, unspecified: Secondary | ICD-10-CM | POA: Diagnosis not present

## 2020-01-31 DIAGNOSIS — R350 Frequency of micturition: Secondary | ICD-10-CM | POA: Diagnosis not present

## 2020-01-31 DIAGNOSIS — Z87891 Personal history of nicotine dependence: Secondary | ICD-10-CM | POA: Diagnosis not present

## 2020-01-31 DIAGNOSIS — R32 Unspecified urinary incontinence: Secondary | ICD-10-CM | POA: Diagnosis not present

## 2020-01-31 DIAGNOSIS — I10 Essential (primary) hypertension: Secondary | ICD-10-CM | POA: Diagnosis not present

## 2020-01-31 DIAGNOSIS — M1712 Unilateral primary osteoarthritis, left knee: Secondary | ICD-10-CM | POA: Diagnosis not present

## 2020-01-31 DIAGNOSIS — Z6829 Body mass index (BMI) 29.0-29.9, adult: Secondary | ICD-10-CM | POA: Diagnosis not present

## 2020-02-13 ENCOUNTER — Telehealth: Payer: Self-pay | Admitting: Urology

## 2020-02-13 NOTE — Telephone Encounter (Signed)
I called pts daughter to reschedule from 02/28/20 and she states she would like a nurse to call her back regarding his issues. She doesn't feel he can wait until 04/18/20.

## 2020-02-14 NOTE — Telephone Encounter (Signed)
Called and spoke with daughter, Sharee Pimple. Per daughter pt recently seen by PCP for UTI. Daughter states appox 1 month ago and still having symptoms of weakness and not feeling well. PCP is running further tests for pts symptoms. Informed daughter to keep scheduled appt with PCP for labs and f/u with pcp regarding pt weakness. appt given for urology f/u in April. Daughter voiced understanding.

## 2020-02-19 DIAGNOSIS — R32 Unspecified urinary incontinence: Secondary | ICD-10-CM | POA: Diagnosis not present

## 2020-02-19 DIAGNOSIS — M109 Gout, unspecified: Secondary | ICD-10-CM | POA: Diagnosis not present

## 2020-02-28 ENCOUNTER — Ambulatory Visit: Payer: PPO | Admitting: Urology

## 2020-03-02 DIAGNOSIS — M1712 Unilateral primary osteoarthritis, left knee: Secondary | ICD-10-CM | POA: Diagnosis not present

## 2020-03-02 DIAGNOSIS — Z299 Encounter for prophylactic measures, unspecified: Secondary | ICD-10-CM | POA: Diagnosis not present

## 2020-03-02 DIAGNOSIS — E669 Obesity, unspecified: Secondary | ICD-10-CM | POA: Diagnosis not present

## 2020-03-02 DIAGNOSIS — I10 Essential (primary) hypertension: Secondary | ICD-10-CM | POA: Diagnosis not present

## 2020-03-02 DIAGNOSIS — Z87891 Personal history of nicotine dependence: Secondary | ICD-10-CM | POA: Diagnosis not present

## 2020-03-02 DIAGNOSIS — R531 Weakness: Secondary | ICD-10-CM | POA: Diagnosis not present

## 2020-03-02 DIAGNOSIS — Z6829 Body mass index (BMI) 29.0-29.9, adult: Secondary | ICD-10-CM | POA: Diagnosis not present

## 2020-03-12 ENCOUNTER — Ambulatory Visit: Payer: PPO

## 2020-03-12 ENCOUNTER — Encounter: Payer: Self-pay | Admitting: Orthopedic Surgery

## 2020-03-12 ENCOUNTER — Other Ambulatory Visit: Payer: Self-pay

## 2020-03-12 ENCOUNTER — Ambulatory Visit: Payer: PPO | Admitting: Orthopedic Surgery

## 2020-03-12 VITALS — BP 150/80 | HR 86 | Ht 69.0 in | Wt 195.0 lb

## 2020-03-12 DIAGNOSIS — M25562 Pain in left knee: Secondary | ICD-10-CM

## 2020-03-12 DIAGNOSIS — G8929 Other chronic pain: Secondary | ICD-10-CM | POA: Diagnosis not present

## 2020-03-12 NOTE — Patient Instructions (Signed)

## 2020-03-12 NOTE — Progress Notes (Signed)
Jose Freeman  03/12/2020  Body mass index is 28.8 kg/m.   HISTORY SECTION :  Chief Complaint  Patient presents with  . Knee Pain    left knee pain x 4-6 months    86 year old male presents from Dr. Raul Del office with notes indicating that he has a normal hemoglobin normal creatinine that he was given an injection for his knee arthritis he does have incontinence from previous prostate surgery  His creatinine was 0.82 his hemoglobin was 16.1 LFTs were normal  The patient says his entire knee joint hurts even despite an injection and taking oral anti-inflammatory medication for several months.  7 difficulty walking he requires the use of a cane he has trouble getting up and down and his activities of daily living seem like they are starting to bother him more  His pain is worsened over 6 months.  His anti-inflammatory was meloxicam he also had a knee brace    Review of Systems  Genitourinary:       Incontinence from prostate surgery  All other systems reviewed and are negative.    has a past medical history of Cancer ([place]) (1996).   Past Surgical History:  Procedure Laterality Date  . APPENDECTOMY    . LAMINECTOMY    . PROSTATE SURGERY      Body mass index is 28.8 kg/m.   Not on File   Current Outpatient Medications:  .  amLODipine (NORVASC) 5 MG tablet, Take 5 mg by mouth daily., Disp: , Rfl:  .  aspirin EC 81 MG tablet, Take 81 mg by mouth daily., Disp: , Rfl:  .  bicalutamide (CASODEX) 50 MG tablet, Take 50 mg by mouth daily., Disp: , Rfl:  .  lisinopril (ZESTRIL) 10 MG tablet, Take 10 mg by mouth daily., Disp: , Rfl:  .  meloxicam (MOBIC) 15 MG tablet, Take 15 mg by mouth daily., Disp: , Rfl:    PHYSICAL EXAM SECTION: 1) BP (!) 150/80   Pulse 86   Ht 5\' 9"  (1.753 m)   Wt 195 lb (88.5 kg)   BMI 28.80 kg/m   Body mass index is 28.8 kg/m. General appearance: Well-developed well-nourished no gross deformities  2) Cardiovascular normal pulse and perfusion ,  normal color   3) Neurologically deep tendon reflexes are equal and normal, no sensation loss or deficits no pathologic reflexes  4) Psychological: Awake alert and oriented x3 mood and affect normal  5) Skin no lacerations or ulcerations no nodularity no palpable masses, no erythema or nodularity  6) Musculoskeletal:   Cane altered gait limp favors left knee  Left knee skin is intact tenderness is diffuse range of motion he has a 8-90  No instability in either knee muscle tone normal both knees  -95 degree range of motion measured with goniometer on the left and similar on the right   MEDICAL DECISION MAKING  A.  Encounter Diagnosis  Name Primary?  . Chronic pain of left knee Yes    B. DATA ANALYSED:  IMAGING: Independent interpretation of images: No, office films show complete obliteration of the medial compartment there are secondary bone changes as well.  He has a mild less than 10 degree varus deformity.  Orders: No new order  Outside records reviewed: Yes   C. MANAGEMENT   He is actually a surgical candidate with appropriate preop consultation with urology and medicine but he wants to try injection and change in medication  Switch him over to diclofenac and inject left  knee  Procedure note left knee injection   verbal consent was obtained to inject left knee joint  Timeout was completed to confirm the site of injection  The medications used were 40 mg of Depo-Medrol and 1% lidocaine 3 cc  Anesthesia was provided by ethyl chloride and the skin was prepped with alcohol.  After cleaning the skin with alcohol a 20-gauge needle was used to inject the left knee joint. There were no complications. A sterile bandage was applied.  I will see him in 2 months we will see where at  No orders of the defined types were placed in this encounter.     Arther Abbott, MD  03/12/2020 3:25 PM

## 2020-04-10 ENCOUNTER — Ambulatory Visit: Payer: PPO | Admitting: Urology

## 2020-05-11 ENCOUNTER — Ambulatory Visit (INDEPENDENT_AMBULATORY_CARE_PROVIDER_SITE_OTHER): Payer: PPO | Admitting: Orthopedic Surgery

## 2020-05-11 ENCOUNTER — Encounter: Payer: Self-pay | Admitting: Orthopedic Surgery

## 2020-05-11 ENCOUNTER — Other Ambulatory Visit: Payer: Self-pay

## 2020-05-11 VITALS — BP 172/84 | HR 83 | Ht 69.0 in | Wt 195.0 lb

## 2020-05-11 DIAGNOSIS — R269 Unspecified abnormalities of gait and mobility: Secondary | ICD-10-CM

## 2020-05-11 DIAGNOSIS — G8929 Other chronic pain: Secondary | ICD-10-CM | POA: Diagnosis not present

## 2020-05-11 DIAGNOSIS — M25562 Pain in left knee: Secondary | ICD-10-CM

## 2020-05-11 NOTE — Progress Notes (Signed)
Chief Complaint  Patient presents with  . Knee Pain    left/ feels better after injection    86 year old male treated with injection for his left knee pain from arthritis.  He says it did help him a lot but he is not satisfied  His daughter is with him today  He is taking his meloxicam 15 mg by mouth daily but he wants his knee "fixed"  Other confounding issues include incontinence from prostate surgery  They also indicate that he is having trouble with frequent falls.  I discussed this with him and his daughter and advised that they be evaluated for balance issues and have balance training prior to knee replacement surgery.  As long as he is medical conditions are stable and he has only listed hypertension amlodipine and Zestril are taken for that he also takes Casodex a baby aspirin but otherwise says he feels good  He should be able to have his knee replacement.  We may get urologist involved preoperatively to address his incontinence as this could be an issue postop.  Follow-up in a month to preop him for total knee

## 2020-05-16 DIAGNOSIS — R350 Frequency of micturition: Secondary | ICD-10-CM | POA: Diagnosis not present

## 2020-05-16 DIAGNOSIS — I10 Essential (primary) hypertension: Secondary | ICD-10-CM | POA: Diagnosis not present

## 2020-05-16 DIAGNOSIS — M1712 Unilateral primary osteoarthritis, left knee: Secondary | ICD-10-CM | POA: Diagnosis not present

## 2020-05-16 DIAGNOSIS — M1710 Unilateral primary osteoarthritis, unspecified knee: Secondary | ICD-10-CM | POA: Diagnosis not present

## 2020-05-16 DIAGNOSIS — Z6829 Body mass index (BMI) 29.0-29.9, adult: Secondary | ICD-10-CM | POA: Diagnosis not present

## 2020-05-16 DIAGNOSIS — Z299 Encounter for prophylactic measures, unspecified: Secondary | ICD-10-CM | POA: Diagnosis not present

## 2020-05-16 DIAGNOSIS — N3090 Cystitis, unspecified without hematuria: Secondary | ICD-10-CM | POA: Diagnosis not present

## 2020-05-21 DIAGNOSIS — I10 Essential (primary) hypertension: Secondary | ICD-10-CM | POA: Diagnosis not present

## 2020-05-21 DIAGNOSIS — M1712 Unilateral primary osteoarthritis, left knee: Secondary | ICD-10-CM | POA: Diagnosis not present

## 2020-05-22 DIAGNOSIS — M1712 Unilateral primary osteoarthritis, left knee: Secondary | ICD-10-CM | POA: Diagnosis not present

## 2020-05-24 DIAGNOSIS — M1712 Unilateral primary osteoarthritis, left knee: Secondary | ICD-10-CM | POA: Diagnosis not present

## 2020-05-28 DIAGNOSIS — M1712 Unilateral primary osteoarthritis, left knee: Secondary | ICD-10-CM | POA: Diagnosis not present

## 2020-05-30 DIAGNOSIS — M1712 Unilateral primary osteoarthritis, left knee: Secondary | ICD-10-CM | POA: Diagnosis not present

## 2020-06-04 DIAGNOSIS — M1712 Unilateral primary osteoarthritis, left knee: Secondary | ICD-10-CM | POA: Diagnosis not present

## 2020-06-05 ENCOUNTER — Ambulatory Visit: Payer: PPO | Admitting: Urology

## 2020-06-06 DIAGNOSIS — M1712 Unilateral primary osteoarthritis, left knee: Secondary | ICD-10-CM | POA: Diagnosis not present

## 2020-06-13 ENCOUNTER — Telehealth: Payer: Self-pay | Admitting: Urology

## 2020-06-13 NOTE — Telephone Encounter (Signed)
Called daughter back. Offered July 13th 11am double book appt. Daughter accepted.

## 2020-06-13 NOTE — Telephone Encounter (Signed)
Pt daughter called, she noticed the pt appt has been rescheduled. She expressed frustration due to the fact this his appt has been rescheduled 4 times since March. She wishes for a call back from a nurse and a sooner appt.

## 2020-06-18 ENCOUNTER — Other Ambulatory Visit: Payer: Self-pay

## 2020-06-18 ENCOUNTER — Ambulatory Visit (INDEPENDENT_AMBULATORY_CARE_PROVIDER_SITE_OTHER): Payer: PPO | Admitting: Orthopedic Surgery

## 2020-06-18 ENCOUNTER — Encounter: Payer: Self-pay | Admitting: Orthopedic Surgery

## 2020-06-18 VITALS — BP 157/81 | HR 72 | Ht 69.0 in | Wt 196.5 lb

## 2020-06-18 DIAGNOSIS — G8929 Other chronic pain: Secondary | ICD-10-CM | POA: Diagnosis not present

## 2020-06-18 DIAGNOSIS — M25562 Pain in left knee: Secondary | ICD-10-CM | POA: Diagnosis not present

## 2020-06-18 NOTE — Progress Notes (Signed)
preop   Chief Complaint  Patient presents with  . Knee Pain    L/hurting some and ready to take care of it.    HISTORY SECTION :       Chief Complaint  Patient presents with  . Knee Pain      left knee pain x 4-6 months     86 year old male presents from Dr. Raul Del office with notes indicating that he has a normal hemoglobin normal creatinine that he was given an injection for his knee arthritis he does have incontinence from previous prostate surgery   His creatinine was 0.82 his hemoglobin was 16.1 LFTs were normal   The patient says his entire knee joint hurts even despite an injection and taking oral anti-inflammatory medication for several months.  7 difficulty walking he requires the use of a cane he has trouble getting up and down and his activities of daily living seem like they are starting to bother him more   His pain is worsened over 6 months.  His anti-inflammatory was meloxicam he also had a knee brace       Review of Systems  Genitourinary:       Incontinence from prostate surgery  All other systems reviewed and are negative.      has a past medical history of Cancer ([place]) (1996).         Past Surgical History:  Procedure Laterality Date  . APPENDECTOMY      . LAMINECTOMY      . PROSTATE SURGERY          Body mass index is 28.8 kg/m.     Not on File     Current Outpatient Medications:  .  amLODipine (NORVASC) 5 MG tablet, Take 5 mg by mouth daily., Disp: , Rfl:  .  aspirin EC 81 MG tablet, Take 81 mg by mouth daily., Disp: , Rfl:  .  bicalutamide (CASODEX) 50 MG tablet, Take 50 mg by mouth daily., Disp: , Rfl:  .  lisinopril (ZESTRIL) 10 MG tablet, Take 10 mg by mouth daily., Disp: , Rfl:  .  meloxicam (MOBIC) 15 MG tablet, Take 15 mg by mouth daily., Disp: , Rfl:      PHYSICAL EXAM SECTION: 1) BP (!) 157/81   Pulse 72   Ht 5\' 9"  (1.753 m)   Wt 196 lb 8 oz (89.1 kg)   BMI 29.02 kg/m   General appearance: Well-developed well-nourished no  gross deformities  2) Cardiovascular normal pulse and perfusion , normal color bilateral peripheral pitting edema  3) Neurologically deep tendon reflexes are equal and normal, no sensation loss or deficits no pathologic reflexes   4) Psychological: Awake alert and oriented x3 mood and affect normal   5) Skin no lacerations or ulcerations no nodularity no palpable masses, no erythema or nodularity   6) Musculoskeletal:    Cane altered gait limp favors left knee   Left knee skin is intact tenderness is diffuse range of motion is 8-1 05, ligaments are stable muscle strength and tone are normal      MEDICAL DECISION MAKING   A.      Encounter Diagnosis  Name Primary?  . Chronic pain of left knee Yes     The procedure has been fully reviewed with the patient; The risks and benefits of surgery have been discussed and explained and understood. Alternative treatment has also been reviewed, questions were encouraged and answered. The postoperative plan is also been reviewed.   Left  total knee  Patient has 2 steps to get into his daughter's house who will be with him after surgery  Plan for 1 day overnight stay

## 2020-06-18 NOTE — Patient Instructions (Signed)
You have decided to proceed with knee replacement surgery. You have decided not to continue with nonoperative measures such as but not limited to oral medication, weight loss, activity modification, physical therapy, bracing, or injection.  We will perform the procedure commonly known as total knee replacement. Some of the risks associated with knee replacement surgery include but are not limited to Bleeding Infection Swelling Stiffness Blood clot Pulmonary embolism  Loosening of the implant Pain that persists even after surgery  Infection is especially devastating complication of knee surgery although rare. If infection does occur your implant will usually have to be removed and several surgeries and antibiotics will be needed to eradicate the infection prior to performing a repeat replacement.   In some cases amputation is required to eradicate the infection. In other rare cases a knee fusion is needed   In compliance with recent [place] law in federal regulation regarding opioid use and abuse and addiction, we will taper (stop) opioid medication after 2 weeks.  If you're not comfortable with these risks and would like to continue with nonoperative treatment please let Dr. Ruhi Kopke know prior to your surgery.  

## 2020-06-19 ENCOUNTER — Telehealth: Payer: Self-pay | Admitting: Orthopedic Surgery

## 2020-06-19 ENCOUNTER — Ambulatory Visit: Payer: PPO | Admitting: Urology

## 2020-06-19 NOTE — Telephone Encounter (Signed)
She has requested Jul 27th instead of July 20th. I have moved it.

## 2020-06-19 NOTE — Telephone Encounter (Signed)
Mr. Sebo daughter Gerlene Fee called and wanted to speak with you regarding her Dad's surgery.  There is a DPR on file for [place] to speak with her.  Please call Sharee Pimple at [phone]  Thanks

## 2020-06-20 DIAGNOSIS — I10 Essential (primary) hypertension: Secondary | ICD-10-CM | POA: Diagnosis not present

## 2020-06-20 DIAGNOSIS — R32 Unspecified urinary incontinence: Secondary | ICD-10-CM | POA: Diagnosis not present

## 2020-07-03 ENCOUNTER — Ambulatory Visit: Payer: PPO | Admitting: Urology

## 2020-07-03 ENCOUNTER — Other Ambulatory Visit: Payer: Self-pay

## 2020-07-03 ENCOUNTER — Encounter: Payer: Self-pay | Admitting: Urology

## 2020-07-03 VITALS — BP 159/81 | HR 77 | Temp 97.7°F | Ht 69.0 in | Wt 192.0 lb

## 2020-07-03 DIAGNOSIS — C61 Malignant neoplasm of prostate: Secondary | ICD-10-CM | POA: Diagnosis not present

## 2020-07-03 LAB — POCT URINALYSIS DIPSTICK
Bilirubin, UA: NEGATIVE
Glucose, UA: NEGATIVE
Ketones, UA: NEGATIVE
Leukocytes, UA: NEGATIVE
Nitrite, UA: NEGATIVE
Protein, UA: NEGATIVE
Spec Grav, UA: 1.02 (ref 1.010–1.025)
Urobilinogen, UA: 0.2 E.U./dL
pH, UA: 5 (ref 5.0–8.0)

## 2020-07-03 NOTE — Progress Notes (Signed)
Urological Symptom Review  Patient is experiencing the following symptoms: Get up at night to urinate Leakage of urine   Review of Systems  Gastrointestinal (upper)  : Negative for upper GI symptoms  Gastrointestinal (lower) : Negative for lower GI symptoms  Constitutional : Negative for symptoms  Skin: Negative for skin symptoms  Eyes: Negative for eye symptoms  Ear/Nose/Throat : Negative for Ear/Nose/Throat symptoms  Hematologic/Lymphatic: Negative for Hematologic/Lymphatic symptoms  Cardiovascular : Negative for cardiovascular symptoms  Respiratory : Negative for respiratory symptoms  Endocrine: Negative for endocrine symptoms  Musculoskeletal: Negative for musculoskeletal symptoms  Neurological: Negative for neurological symptoms  Psychologic: Negative for psychiatric symptoms

## 2020-07-03 NOTE — Progress Notes (Signed)
H&P  Chief Complaint: Prostate cancer  History of Present Illness:      Prior to his presentation to me, he was seen by Dr. Nelida Gores, as well as Dr. Mick Sell in [place].   He was originally diagnosed in May of 1995, with a PSA of 25.3. He underwent radical retropubic prostatectomy by Dr. Reece Agar in June, 1995. He had a stage pT3b NoMx prostate cancer, GS 4+4. Both right and left margins were positive. His postoperative course was complicated by cecal perforation, necessitating segmental colectomy. His PSA never became undetectable. 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 He was followed on a q.6 month basis, and anti-androgen therapy was initiated in October of 2008. At that time, his PSA was 23.85. He had been on incomplete ARB with the flutamide prior to him being switched back to Casodex in May, 2013.   When his Casodex was instituted, he had decrease of his PSA from 8.94 in May, 2013 down to 1.62 in November, 2013. His PSA checked in May, 2014 and was up slightly to 1.9. PSA on 09/01/2013 was 2.60. It was 3.96 on 1.20.2015. It was 6.83 on 5.25.2015. Interestingly, he had a downward turn of his PSA, despite being on only the same dose of Casodex. Bone scan from June, 2016 showed no evidence of osseous metastatic disease.   8.2.2019: Most recent PSA 2.5.2019 <0.1. He has a decreasing stream with worsening incontinence. Recently treated for UTI with doxycycline.   3.10.2020: No recent UTI. He has nocturia x 2-3. Good stream. Some leakage--SUI>UUI. No blood in urine. 1 PPD. He is still on bicalutamide.   9.22.2020: PSA remains undetectable. He continues to have issues with incontinence (1-2 pads a day) and 2-4x nocturia, but he states that this varies with his fluid intake. He denies any significant changes since last visit. He denies any infections or blood per urine/stool since last visit. He notes that he also  intermittently has issues with incomplete emptying.     7.13.2021: No real urologic issues noted over the past year.  He still has daytime stress incontinence, 1-2 pads per day.  He is drier at night.  He typically feels like he empties out well.  He has been treated for 1 urinary tract infection over the past year, in January.  Currently, no dysuria, no gross hematuria, he does not feel like he has an infection.  PSA has not been checked that he knows of since a year ago, when it was undetectable.  He is still on bicalutamide 50 mg daily.   Past Medical History:  Diagnosis Date  . Cancer [facility]) 1996   prostate     Past Surgical History:  Procedure Laterality Date  . APPENDECTOMY    . LAMINECTOMY    . PROSTATE SURGERY      Home Medications:  Allergies as of 07/03/2020   No Known Allergies     Medication List       Accurate as of July 03, 2020 11:22 AM. If you have any questions, ask your nurse or doctor.        amLODipine 5 MG tablet Commonly known as: NORVASC Take 5 mg by mouth daily.   aspirin EC 81 MG tablet Take 81 mg by mouth daily.   bicalutamide 50 MG tablet Commonly known as: CASODEX Take 50 mg by mouth daily.   ibuprofen 200 MG tablet Commonly known as: ADVIL Take 400 mg by mouth  every 6 (six) hours as needed for mild pain or moderate pain.   lisinopril 10 MG tablet Commonly known as: ZESTRIL Take 10 mg by mouth daily.   meloxicam 15 MG tablet Commonly known as: MOBIC Take 15 mg by mouth daily.       Allergies: No Known Allergies  Family History  Problem Relation Age of Onset  . Healthy Mother   . Healthy Father     Social History:  reports that he quit smoking about 27 years ago. His smoking use included cigarettes. He has never used smokeless tobacco. No history on file for alcohol use and drug use.  ROS: A complete review of systems was performed.  All systems are negative except for pertinent findings as noted.  Physical Exam:  Vital  signs in last 24 hours: BP (!) 159/81   Pulse 77   Temp 97.7 F (36.5 C)   Ht 5\' 9"  (1.753 m)   Wt 192 lb (87.1 kg)   BMI 28.35 kg/m  Constitutional:  Alert and oriented, No acute distress Cardiovascular: Regular rate  Respiratory: Normal respiratory effort GI: Abdomen is soft, nontender, nondistended, no abdominal masses. No hernias. Genitourinary: Fine myotic foreskin, testes are descended bilaterally and non-tender and without masses, scrotum is normal in appearance without lesions or masses, perineum is normal on inspection. Neurologic: Grossly intact, no focal deficits Psychiatric: Normal mood and affect  I have reviewed prior pt notes  I have reviewed notes from referring/previous physicians  I have reviewed urinalysis results  I have reviewed prior PSA results  Impression/Assessment:  Prostate cancer with PSA recurrence following prostatectomy, he is doing well on antiandrogen therapy alone, Casodex 50 mg daily.  Plan:  His PSA is checked today  I will see back on an annual basis

## 2020-07-09 ENCOUNTER — Other Ambulatory Visit: Payer: Self-pay | Admitting: Orthopedic Surgery

## 2020-07-09 DIAGNOSIS — M1712 Unilateral primary osteoarthritis, left knee: Secondary | ICD-10-CM

## 2020-07-12 NOTE — Patient Instructions (Signed)
Jose Freeman  07/12/2020     @PREFPERIOPPHARMACY @   Your procedure is scheduled on  07/17/2020   Report to [facility] at  [email or web address].  Call this number if you have problems the morning of surgery:  [phone]   Remember:  Do not eat or drink after midnight.                         Take these medicines the morning of surgery with A SIP OF WATER  Amlodipine, casodex, mobic(if needed).    Do not wear jewelry, make-up or nail polish.  Do not wear lotions, powders, or perfumes. Please wear deodorant and brush your teeth.  Do not shave 48 hours prior to surgery.  Men may shave face and neck.  Do not bring valuables to the hospital.  [facility] is not responsible for any belongings or valuables.  Contacts, dentures or bridgework may not be worn into surgery.  Leave your suitcase in the car.  After surgery it may be brought to your room.  For patients admitted to the hospital, discharge time will be determined by your treatment team.  Patients discharged the day of surgery will not be allowed to drive home.   Name and phone number of your driver:   family Special instructions:  DO NOT smoke the morning of your procedure.  Please read over the following fact sheets that you were given. Anesthesia Post-op Instructions and Care and Recovery After Surgery       Total Knee Replacement, Care After This sheet gives you information about how to care for yourself after your procedure. Your health care provider may also give you more specific instructions. If you have problems or questions, contact your health care provider. What can I expect after the procedure? After the procedure, it is common to have:  Pain.  Swelling.  A small amount of blood or clear fluid coming from your incision.  Limited range of motion. Follow these instructions at home: Medicines  Take over-the-counter and prescription medicines only as told by your health care provider.  If you were  prescribed a blood thinner (anticoagulant), take it as told by your health care provider.  Ask your health care provider if the medicine prescribed to you: ? Requires you to avoid driving or using heavy machinery. ? Can cause constipation. You may need to take actions to prevent or treat constipation, such as:  Drink enough fluid to keep your urine pale yellow.  Take over-the-counter or prescription medicines.  Eat foods that are high in fiber, such as beans, whole grains, and fresh fruits and vegetables.  Limit foods that are high in fat and processed sugars, such as fried or sweet foods. Bathing  Do not take baths, swim, or use a hot tub until your health care provider approves. Ask your health care provider if you may take showers. You may only be allowed to take sponge baths.  Keep your bandage (dressing) dry until your health care provider says it can be removed. Incision care and drain care   Follow instructions from your health care provider about how to take care of your incision. Make sure you: ? Wash your hands with soap and water before and after you change your dressing. If soap and water are not available, use hand sanitizer. ? Change your dressing as told by your health care provider. ? Leave stitches (sutures), skin glue,  or adhesive strips in place. These skin closures may need to stay in place for 2 weeks or longer. If adhesive strip edges start to loosen and curl up, you may trim the loose edges. Do not remove adhesive strips completely unless your health care provider tells you to do that.  Check your incision area and drain site every day for signs of infection. Check for: ? More redness, swelling, or pain. ? More fluid or blood. ? Warmth. ? Pus or a bad smell.  If you have a drain, follow instructions from your health care provider about caring for it. Managing pain, stiffness, and swelling      If directed, put ice on your knee. ? Put ice in a plastic bag  or use the icing device (cold flow pad or cryocuff) that you were given. Follow instructions from your health care provider about how to use the icing device. ? Place a towel between your skin and the bag or between your skin and the icing device. ? Leave the ice on for 20 minutes, 2-3 times per day.  If directed, apply heat to the affected area before you exercise. Use the heat source that your health care provider recommends, such as a moist heat pack or a heating pad. ? Place a towel between your skin and the heat source. ? Leave the heat on for 20-30 minutes. ? Remove the heat if your skin turns bright red. This is especially important if you are unable to feel pain, heat, or cold. You may have a greater risk of getting burned.  Move your toes often to avoid stiffness and to lessen swelling.  Raise (elevate) your leg above the level of your heart while you are sitting or lying down. ? Use several pillows to keep your leg straight. ? Do not put a pillow just under the knee. If the knee is bent for a long time, this may lead to stiffness.  Wear elastic knee support as told by your health care provider. Activity  Rest as told by your health care provider.  Avoid sitting for a long time without moving. Get up to take short walks every 1-2 hours. This is important to improve blood flow and breathing. Ask for help if you feel weak or unsteady.  Ask your health care provider what activities are safe for you.  Avoid high-impact activities, including running, jumping rope, and jumping jacks.  Do not play contact sports until your health care provider approves.  Do exercises as told by your physical therapist.  If you have been sent home with a continuous passive motion machine, use it as told by your health care provider. Safety   Do not use your leg to support your body weight until your health care provider approves. Use crutches or a walker as told by your health care provider.  Do  not drive until your health care provider approves. Ask your health care provider when it is safe to drive. General instructions  Do not use any products that contain nicotine or tobacco, such as cigarettes, e-cigarettes, and chewing tobacco. These can delay healing after surgery. If you need help quitting, ask your health care provider.  Wear compression stockings as told by your health care provider.  Tell your health care provider if you plan to have dental work. Also: ? Tell your dentist about your joint replacement. ? Ask your health care provider if there are any special instructions you need to follow before having dental care and  routine cleanings.  Keep all follow-up visits as told by your health care provider. This is important. Contact a health care provider if you have:  More redness, swelling, or pain around your incision or drain.  More fluid or blood coming from your incision or drain.  Pus or a bad smell coming from your incision or drain.  Warmth on your incision or drain site.  A fever.  An incision that breaks open.  Knee pain that does not go away.  Range of motion in your knee that is getting worse.  A prosthesis that feels loose. Get help right away if you have:  Pain or swelling in your calf or thigh.  Shortness of breath or difficulty breathing.  Chest pain. Summary  After the procedure, it is common to have pain and swelling, blood or fluid coming from your incision, and limited range of motion.  Follow instructions from your health care provider about how to take care of your incision.  Use crutches or a walker as told by your health care provider.  If you were prescribed a blood thinner (anticoagulant), take it as told by your health care provider.  Keep all follow-up visits as told by your health care provider. This is important. This information is not intended to replace advice given to you by your health care provider. Make sure you  discuss any questions you have with your health care provider. Document Revised: 04/18/2019 Document Reviewed: 07/22/2018 Elsevier Patient Education  2020 [place] Anesthesia, Adult, Care After This sheet gives you information about how to care for yourself after your procedure. Your health care provider may also give you more specific instructions. If you have problems or questions, contact your health care provider. What can I expect after the procedure? After the procedure, the following side effects are common:  Pain or discomfort at the IV site.  Nausea.  Vomiting.  Sore throat.  Trouble concentrating.  Feeling cold or chills.  Weak or tired.  Sleepiness and fatigue.  Soreness and body aches. These side effects can affect parts of the body that were not involved in surgery. Follow these instructions at home:  For at least 24 hours after the procedure:  Have a responsible adult stay with you. It is important to have someone help care for you until you are awake and alert.  Rest as needed.  Do not: ? Participate in activities in which you could fall or become injured. ? Drive. ? Use heavy machinery. ? Drink alcohol. ? Take sleeping pills or medicines that cause drowsiness. ? Make important decisions or sign legal documents. ? Take care of children on your own. Eating and drinking  Follow any instructions from your health care provider about eating or drinking restrictions.  When you feel hungry, start by eating small amounts of foods that are soft and easy to digest (bland), such as toast. Gradually return to your regular diet.  Drink enough fluid to keep your urine pale yellow.  If you vomit, rehydrate by drinking water, juice, or clear broth. General instructions  If you have sleep apnea, surgery and certain medicines can increase your risk for breathing problems. Follow instructions from your health care provider about wearing your sleep  device: ? Anytime you are sleeping, including during daytime naps. ? While taking prescription pain medicines, sleeping medicines, or medicines that make you drowsy.  Return to your normal activities as told by your health care provider. Ask your health care provider what activities  are safe for you.  Take over-the-counter and prescription medicines only as told by your health care provider.  If you smoke, do not smoke without supervision.  Keep all follow-up visits as told by your health care provider. This is important. Contact a health care provider if:  You have nausea or vomiting that does not get better with medicine.  You cannot eat or drink without vomiting.  You have pain that does not get better with medicine.  You are unable to pass urine.  You develop a skin rash.  You have a fever.  You have redness around your IV site that gets worse. Get help right away if:  You have difficulty breathing.  You have chest pain.  You have blood in your urine or stool, or you vomit blood. Summary  After the procedure, it is common to have a sore throat or nausea. It is also common to feel tired.  Have a responsible adult stay with you for the first 24 hours after general anesthesia. It is important to have someone help care for you until you are awake and alert.  When you feel hungry, start by eating small amounts of foods that are soft and easy to digest (bland), such as toast. Gradually return to your regular diet.  Drink enough fluid to keep your urine pale yellow.  Return to your normal activities as told by your health care provider. Ask your health care provider what activities are safe for you. This information is not intended to replace advice given to you by your health care provider. Make sure you discuss any questions you have with your health care provider. Document Revised: 12/11/2017 Document Reviewed: 07/24/2017 Elsevier Patient Education  [place].  Spinal Anesthesia and Epidural Anesthesia, Care After This sheet gives you information about how to care for yourself after your procedure. Your doctor may also give you more specific instructions. If you have problems or questions, call your doctor. Follow these instructions at home: For at least 24 hours after the procedure:   Have a responsible adult stay with you. It is important to have someone help care for you until you are awake and alert.  Rest as needed.  Do not do activities where you could fall or get hurt (injured).  Do not drive.  Do not use heavy machinery.  Do not drink alcohol.  Do not take sleeping pills or medicines that make you sleepy (drowsy).  Do not make important decisions.  Do not sign legal documents.  Do not take care of children on your own. Eating and drinking  If you throw up (vomit), drink water, juice, or soup when nausea and vomiting stop.  Drink enough fluid to keep your pee (urine) pale yellow.  Make sure you do not feel like throwing up (nauseous) before you eat solid foods.  Follow the diet that your doctor recommends. General instructions  Return to your normal activities as told by your doctor. Ask your doctor what activities are safe for you.  Take over-the-counter and prescription medicines only as told by your doctor.  If you have sleep apnea, surgery and certain medicines can raise your risk for breathing problems. Follow instructions from your doctor about when to wear your sleep device. Your doctor may tell you to wear your sleep device: ? Anytime you are sleeping, including during daytime naps. ? While taking prescription pain medicines, sleeping pills, or medicines that make you sleepy.  Do not use any products that contain  nicotine or tobacco. This includes cigarettes and e-cigarettes. ? If you need help quitting, ask your doctor. ? If you smoke, do not smoke by yourself. Make sure someone is nearby in case you  need help.  Keep all follow-up visits as told by your doctor. This is important. Contact a doctor if:  It has been more than one day since your procedure and you feel like throwing up.  It has been more than one day since your procedure and you throw up.  You have a rash. Get help right away if:  You have a fever.  You have a headache that lasts a long time.  You have a very bad headache.  Your vision is blurry.  You see two of a single object (double vision).  You are dizzy or light-headed.  You faint.  Your arms or legs tingle, feel weak, or get numb.  You have trouble breathing.  You cannot pee (urinate). Summary  After the procedure, have a responsible adult stay with you at home until you are fully awake and alert.  Do not do activities that might get you injured. Do not drive, use heavy machinery, drink alcohol, or make important decisions for 24 hours after the procedure.  Take medicines as told by your doctor. Do not use products that contain nicotine or tobacco.  Get help right away if you have a fever, blurry vision, difficulty breathing or passing urine, or weakness or numbness in arms or legs. This information is not intended to replace advice given to you by your health care provider. Make sure you discuss any questions you have with your health care provider. Document Revised: 11/20/2017 Document Reviewed: 03/31/2016 Elsevier Patient Education  [place]. How to Use Chlorhexidine for Bathing Chlorhexidine gluconate (CHG) is a germ-killing (antiseptic) solution that is used to clean the skin. It can get rid of the bacteria that normally live on the skin and can keep them away for about 24 hours. To clean your skin with CHG, you may be given:  A CHG solution to use in the shower or as part of a sponge bath.  A prepackaged cloth that contains CHG. Cleaning your skin with CHG may help lower the risk for infection:  While you are staying in the  intensive care unit of the hospital.  If you have a vascular access, such as a central line, to provide short-term or long-term access to your veins.  If you have a catheter to drain urine from your bladder.  If you are on a ventilator. A ventilator is a machine that helps you breathe by moving air in and out of your lungs.  After surgery. What are the risks? Risks of using CHG include:  A skin reaction.  Hearing loss, if CHG gets in your ears.  Eye injury, if CHG gets in your eyes and is not rinsed out.  The CHG product catching fire. Make sure that you avoid smoking and flames after applying CHG to your skin. Do not use CHG:  If you have a chlorhexidine allergy or have previously reacted to chlorhexidine.  On babies younger than 84 months of age. How to use CHG solution  Use CHG only as told by your health care provider, and follow the instructions on the label.  Use the full amount of CHG as directed. Usually, this is one bottle. During a shower Follow these steps when using CHG solution during a shower (unless your health care provider gives you different instructions):  1. Start the shower. 2. Use your normal soap and shampoo to wash your face and hair. 3. Turn off the shower or move out of the shower stream. 4. Pour the CHG onto a clean washcloth. Do not use any type of brush or rough-edged sponge. 5. Starting at your neck, lather your body down to your toes. Make sure you follow these instructions: ? If you will be having surgery, pay special attention to the part of your body where you will be having surgery. Scrub this area for at least 1 minute. ? Do not use CHG on your head or face. If the solution gets into your ears or eyes, rinse them well with water. ? Avoid your genital area. ? Avoid any areas of skin that have broken skin, cuts, or scrapes. ? Scrub your back and under your arms. Make sure to wash skin folds. 6. Let the lather sit on your skin for 1-2 minutes  or as long as told by your health care provider. 7. Thoroughly rinse your entire body in the shower. Make sure that all body creases and crevices are rinsed well. 8. Dry off with a clean towel. Do not put any substances on your body afterward--such as powder, lotion, or perfume--unless you are told to do so by your health care provider. Only use lotions that are recommended by the manufacturer. 9. Put on clean clothes or pajamas. 10. If it is the night before your surgery, sleep in clean sheets.  During a sponge bath Follow these steps when using CHG solution during a sponge bath (unless your health care provider gives you different instructions): 1. Use your normal soap and shampoo to wash your face and hair. 2. Pour the CHG onto a clean washcloth. 3. Starting at your neck, lather your body down to your toes. Make sure you follow these instructions: ? If you will be having surgery, pay special attention to the part of your body where you will be having surgery. Scrub this area for at least 1 minute. ? Do not use CHG on your head or face. If the solution gets into your ears or eyes, rinse them well with water. ? Avoid your genital area. ? Avoid any areas of skin that have broken skin, cuts, or scrapes. ? Scrub your back and under your arms. Make sure to wash skin folds. 4. Let the lather sit on your skin for 1-2 minutes or as long as told by your health care provider. 5. Using a different clean, wet washcloth, thoroughly rinse your entire body. Make sure that all body creases and crevices are rinsed well. 6. Dry off with a clean towel. Do not put any substances on your body afterward--such as powder, lotion, or perfume--unless you are told to do so by your health care provider. Only use lotions that are recommended by the manufacturer. 7. Put on clean clothes or pajamas. 8. If it is the night before your surgery, sleep in clean sheets. How to use CHG prepackaged cloths  Only use CHG cloths as  told by your health care provider, and follow the instructions on the label.  Use the CHG cloth on clean, dry skin.  Do not use the CHG cloth on your head or face unless your health care provider tells you to.  When washing with the CHG cloth: ? Avoid your genital area. ? Avoid any areas of skin that have broken skin, cuts, or scrapes. Before surgery Follow these steps when using a CHG cloth to  clean before surgery (unless your health care provider gives you different instructions): 1. Using the CHG cloth, vigorously scrub the part of your body where you will be having surgery. Scrub using a back-and-forth motion for 3 minutes. The area on your body should be completely wet with CHG when you are done scrubbing. 2. Do not rinse. Discard the cloth and let the area air-dry. Do not put any substances on the area afterward, such as powder, lotion, or perfume. 3. Put on clean clothes or pajamas. 4. If it is the night before your surgery, sleep in clean sheets.  For general bathing Follow these steps when using CHG cloths for general bathing (unless your health care provider gives you different instructions). 1. Use a separate CHG cloth for each area of your body. Make sure you wash between any folds of skin and between your fingers and toes. Wash your body in the following order, switching to a new cloth after each step: ? The front of your neck, shoulders, and chest. ? Both of your arms, under your arms, and your hands. ? Your stomach and groin area, avoiding the genitals. ? Your right leg and foot. ? Your left leg and foot. ? The back of your neck, your back, and your buttocks. 2. Do not rinse. Discard the cloth and let the area air-dry. Do not put any substances on your body afterward--such as powder, lotion, or perfume--unless you are told to do so by your health care provider. Only use lotions that are recommended by the manufacturer. 3. Put on clean clothes or pajamas. Contact a health  care provider if:  Your skin gets irritated after scrubbing.  You have questions about using your solution or cloth. Get help right away if:  Your eyes become very red or swollen.  Your eyes itch badly.  Your skin itches badly and is red or swollen.  Your hearing changes.  You have trouble seeing.  You have swelling or tingling in your mouth or throat.  You have trouble breathing.  You swallow any chlorhexidine. Summary  Chlorhexidine gluconate (CHG) is a germ-killing (antiseptic) solution that is used to clean the skin. Cleaning your skin with CHG may help to lower your risk for infection.  You may be given CHG to use for bathing. It may be in a bottle or in a prepackaged cloth to use on your skin. Carefully follow your health care provider's instructions and the instructions on the product label.  Do not use CHG if you have a chlorhexidine allergy.  Contact your health care provider if your skin gets irritated after scrubbing. This information is not intended to replace advice given to you by your health care provider. Make sure you discuss any questions you have with your health care provider. Document Revised: 02/24/2019 Document Reviewed: 11/05/2017 Elsevier Patient Education  [place].

## 2020-07-13 ENCOUNTER — Other Ambulatory Visit: Payer: Self-pay

## 2020-07-13 ENCOUNTER — Encounter (HOSPITAL_COMMUNITY): Payer: Self-pay

## 2020-07-13 ENCOUNTER — Encounter (HOSPITAL_COMMUNITY)
Admission: RE | Admit: 2020-07-13 | Discharge: 2020-07-13 | Disposition: A | Discharge status: Home / Self Care | Payer: PPO | Source: Ambulatory Visit | Attending: Orthopedic Surgery | Admitting: Orthopedic Surgery

## 2020-07-13 ENCOUNTER — Other Ambulatory Visit (HOSPITAL_COMMUNITY)
Admission: RE | Admit: 2020-07-13 | Discharge: 2020-07-13 | Disposition: A | Discharge status: Home / Self Care | Payer: PPO | Source: Ambulatory Visit | Attending: Orthopedic Surgery | Admitting: Orthopedic Surgery

## 2020-07-13 DIAGNOSIS — Z01818 Encounter for other preprocedural examination: Secondary | ICD-10-CM | POA: Diagnosis not present

## 2020-07-13 DIAGNOSIS — Z20822 Contact with and (suspected) exposure to covid-19: Secondary | ICD-10-CM | POA: Insufficient documentation

## 2020-07-13 HISTORY — DX: Unspecified osteoarthritis, unspecified site: M19.90

## 2020-07-13 LAB — BASIC METABOLIC PANEL WITH GFR
Anion gap: 9 (ref 5–15)
BUN: 15 mg/dL (ref 8–23)
CO2: 22 mmol/L (ref 22–32)
Calcium: 8.8 mg/dL — ABNORMAL LOW (ref 8.9–10.3)
Chloride: 106 mmol/L (ref 98–111)
Creatinine, Ser: 0.72 mg/dL (ref 0.61–1.24)
GFR calc Af Amer: >60 mL/min
GFR calc non Af Amer: >60 mL/min
Glucose, Bld: 158 mg/dL — ABNORMAL HIGH (ref 70–99)
Potassium: 3.6 mmol/L (ref 3.5–5.1)
Sodium: 137 mmol/L (ref 135–145)

## 2020-07-13 LAB — CBC WITH DIFFERENTIAL/PLATELET
Abs Immature Granulocytes: 0.03 10*3/uL (ref 0.00–0.07)
Basophils Absolute: 0.1 10*3/uL (ref 0.0–0.1)
Basophils Relative: 1 %
Eosinophils Absolute: 0.3 10*3/uL (ref 0.0–0.5)
Eosinophils Relative: 3 %
HCT: 44.1 % (ref 39.0–52.0)
Hemoglobin: 15.3 g/dL (ref 13.0–17.0)
Immature Granulocytes: 0 %
Lymphocytes Relative: 14 %
Lymphs Abs: 1.3 10*3/uL (ref 0.7–4.0)
MCH: 30.6 pg (ref 26.0–34.0)
MCHC: 34.7 g/dL (ref 30.0–36.0)
MCV: 88.2 fL (ref 80.0–100.0)
Monocytes Absolute: 0.8 10*3/uL (ref 0.1–1.0)
Monocytes Relative: 9 %
Neutro Abs: 6.6 10*3/uL (ref 1.7–7.7)
Neutrophils Relative %: 73 %
Platelets: 296 10*3/uL (ref 150–400)
RBC: 5 MIL/uL (ref 4.22–5.81)
RDW: 13.1 % (ref 11.5–15.5)
WBC: 9.1 10*3/uL (ref 4.0–10.5)
nRBC: 0 % (ref 0.0–0.2)

## 2020-07-13 LAB — PREPARE RBC (CROSSMATCH)

## 2020-07-14 LAB — SARS CORONAVIRUS 2 (TAT 6-24 HRS): SARS Coronavirus 2: NEGATIVE

## 2020-07-16 NOTE — H&P (Signed)
preop    Chief Complaint  Patient presents with  . Knee Pain      L/hurting some and ready to take care of it.      HISTORY SECTION :          Chief Complaint  Patient presents with  . Knee Pain      left knee pain x 4-6 months     86 year old male presents from Dr. Raul Del office with notes indicating that he has a normal hemoglobin normal creatinine that he was given an injection for his knee arthritis he does have incontinence from previous prostate surgery   His creatinine was 0.82 his hemoglobin was 16.1 LFTs were normal   The patient says his entire knee joint hurts even despite an injection and taking oral anti-inflammatory medication for several months.  7 difficulty walking he requires the use of a cane he has trouble getting up and down and his activities of daily living seem like they are starting to bother him more   His pain is worsened over 6 months.  His anti-inflammatory was meloxicam he also had a knee brace       Review of Systems  Genitourinary:       Incontinence from prostate surgery  All other systems reviewed and are negative.      has a past medical history of Cancer ([place]) (1996).             Past Surgical History:  Procedure Laterality Date  . APPENDECTOMY      . LAMINECTOMY      . PROSTATE SURGERY          Body mass index is 28.8 kg/m.     Not on File     Current Outpatient Medications:  .  amLODipine (NORVASC) 5 MG tablet, Take 5 mg by mouth daily., Disp: , Rfl:  .  aspirin EC 81 MG tablet, Take 81 mg by mouth daily., Disp: , Rfl:  .  bicalutamide (CASODEX) 50 MG tablet, Take 50 mg by mouth daily., Disp: , Rfl:  .  lisinopril (ZESTRIL) 10 MG tablet, Take 10 mg by mouth daily., Disp: , Rfl:  .  meloxicam (MOBIC) 15 MG tablet, Take 15 mg by mouth daily., Disp: , Rfl:      PHYSICAL EXAM SECTION: 1) BP (!) 157/81   Pulse 72   Ht 5\' 9"  (1.753 m)   Wt 196 lb 8 oz (89.1 kg)   BMI 29.02 kg/m    General appearance: Well-developed  well-nourished no gross deformities  2) Cardiovascular normal pulse and perfusion , normal color bilateral peripheral pitting edema  3) Neurologically deep tendon reflexes are equal and normal, no sensation loss or deficits no pathologic reflexes   4) Psychological: Awake alert and oriented x3 mood and affect normal   5) Skin no lacerations or ulcerations no nodularity no palpable masses, no erythema or nodularity   6) Musculoskeletal:    Cane altered gait limp favors left knee   Left knee skin is intact tenderness is diffuse range of motion is 8-1 05, ligaments are stable muscle strength and tone are normal       MEDICAL DECISION MAKING   A.         Encounter Diagnosis  Name Primary?  . Chronic pain of left knee Yes     The procedure has been fully reviewed with the patient; The risks and benefits of surgery have been discussed and explained and understood. Alternative treatment has also  been reviewed, questions were encouraged and answered. The postoperative plan is also been reviewed.     Left total knee   Patient has 2 steps to get into his daughter's house who will be with him after surgery   Plan for 1 day overnight stay  07/16/2020

## 2020-07-17 ENCOUNTER — Ambulatory Visit (HOSPITAL_COMMUNITY): Payer: PPO | Admitting: Anesthesiology

## 2020-07-17 ENCOUNTER — Ambulatory Visit (HOSPITAL_COMMUNITY): Payer: PPO

## 2020-07-17 ENCOUNTER — Ambulatory Visit: Payer: PPO | Admitting: Urology

## 2020-07-17 ENCOUNTER — Encounter (HOSPITAL_COMMUNITY)
Admission: RE | Disposition: A | Discharge status: Home / Self Care | Payer: Self-pay | Source: Home / Self Care | Attending: Orthopedic Surgery

## 2020-07-17 ENCOUNTER — Encounter (HOSPITAL_COMMUNITY): Payer: Self-pay | Admitting: Orthopedic Surgery

## 2020-07-17 ENCOUNTER — Observation Stay (HOSPITAL_COMMUNITY)
Admission: RE | Admit: 2020-07-17 | Discharge: 2020-07-18 | Disposition: A | Discharge status: Home / Self Care | Payer: PPO | Attending: Orthopedic Surgery | Admitting: Orthopedic Surgery

## 2020-07-17 DIAGNOSIS — Z471 Aftercare following joint replacement surgery: Secondary | ICD-10-CM | POA: Diagnosis not present

## 2020-07-17 DIAGNOSIS — M1712 Unilateral primary osteoarthritis, left knee: Secondary | ICD-10-CM | POA: Diagnosis not present

## 2020-07-17 DIAGNOSIS — Z96652 Presence of left artificial knee joint: Secondary | ICD-10-CM

## 2020-07-17 DIAGNOSIS — M25562 Pain in left knee: Secondary | ICD-10-CM | POA: Diagnosis present

## 2020-07-17 DIAGNOSIS — Z859 Personal history of malignant neoplasm, unspecified: Secondary | ICD-10-CM | POA: Insufficient documentation

## 2020-07-17 HISTORY — PX: TOTAL KNEE ARTHROPLASTY: SHX125

## 2020-07-17 LAB — ABO/RH: ABO/RH(D): AB POS

## 2020-07-17 SURGERY — ARTHROPLASTY, KNEE, TOTAL
Anesthesia: General | Site: Knee | Laterality: Left

## 2020-07-17 MED ORDER — TRANEXAMIC ACID-NACL 1000-0.7 MG/100ML-% IV SOLN
1000.0000 mg | INTRAVENOUS | Status: AC
  Administered 2020-07-17: 1000 mg via INTRAVENOUS
  Filled 2020-07-17: qty 100

## 2020-07-17 MED ORDER — ONDANSETRON HCL 4 MG/2ML IJ SOLN: 4.0000 mg | Freq: Four times a day (QID) | INTRAMUSCULAR | Status: DC | PRN

## 2020-07-17 MED ORDER — ONDANSETRON HCL 4 MG/2ML IJ SOLN
INTRAMUSCULAR | Status: AC
  Filled 2020-07-17: qty 2

## 2020-07-17 MED ORDER — ONDANSETRON HCL 4 MG/2ML IJ SOLN: 4.0000 mg | Freq: Once | INTRAMUSCULAR | Status: DC | PRN

## 2020-07-17 MED ORDER — TRAMADOL HCL 50 MG PO TABS
50.0000 mg | ORAL_TABLET | Freq: Four times a day (QID) | ORAL | Status: DC
  Administered 2020-07-18 (×3): 50 mg via ORAL
  Filled 2020-07-17 (×4): qty 1

## 2020-07-17 MED ORDER — CEFAZOLIN SODIUM-DEXTROSE 2-4 GM/100ML-% IV SOLN
2.0000 g | Freq: Four times a day (QID) | INTRAVENOUS | Status: AC
  Administered 2020-07-17 (×2): 2 g via INTRAVENOUS
  Filled 2020-07-17 (×2): qty 100

## 2020-07-17 MED ORDER — ONDANSETRON HCL 4 MG/2ML IJ SOLN
4.0000 mg | Freq: Once | INTRAMUSCULAR | Status: AC
  Administered 2020-07-17: 4 mg via INTRAVENOUS
  Filled 2020-07-17: qty 2

## 2020-07-17 MED ORDER — DOCUSATE SODIUM 100 MG PO CAPS
100.0000 mg | ORAL_CAPSULE | Freq: Two times a day (BID) | ORAL | Status: DC
  Administered 2020-07-17 – 2020-07-18 (×2): 100 mg via ORAL
  Filled 2020-07-17 (×3): qty 1

## 2020-07-17 MED ORDER — APIXABAN 2.5 MG PO TABS
2.5000 mg | ORAL_TABLET | Freq: Two times a day (BID) | ORAL | Status: DC
  Administered 2020-07-18: 2.5 mg via ORAL
  Filled 2020-07-17: qty 1

## 2020-07-17 MED ORDER — DEXAMETHASONE SODIUM PHOSPHATE 10 MG/ML IJ SOLN
INTRAMUSCULAR | Status: AC
  Filled 2020-07-17: qty 1

## 2020-07-17 MED ORDER — HYDROCODONE-ACETAMINOPHEN 5-325 MG PO TABS
1.0000 | ORAL_TABLET | Freq: Once | ORAL | Status: AC
  Administered 2020-07-17: 1 via ORAL
  Filled 2020-07-17: qty 1

## 2020-07-17 MED ORDER — FLEET ENEMA 7-19 GM/118ML RE ENEM: 1.0000 | ENEMA | Freq: Once | RECTAL | Status: DC | PRN

## 2020-07-17 MED ORDER — ORAL CARE MOUTH RINSE: 15.0000 mL | Freq: Once | OROMUCOSAL | Status: AC

## 2020-07-17 MED ORDER — POVIDONE-IODINE 10 % EX SWAB: 2.0000 "application " | Freq: Once | CUTANEOUS | Status: DC

## 2020-07-17 MED ORDER — TRANEXAMIC ACID-NACL 1000-0.7 MG/100ML-% IV SOLN
1000.0000 mg | Freq: Once | INTRAVENOUS | Status: AC
  Administered 2020-07-17: 1000 mg via INTRAVENOUS
  Filled 2020-07-17: qty 100

## 2020-07-17 MED ORDER — ROPIVACAINE HCL 5 MG/ML IJ SOLN
INTRAMUSCULAR | Status: DC | PRN
Start: 2020-07-17 — End: 2020-07-17
  Administered 2020-07-17: 27 mL via PERINEURAL

## 2020-07-17 MED ORDER — ONDANSETRON HCL 4 MG PO TABS: 4.0000 mg | ORAL_TABLET | Freq: Four times a day (QID) | ORAL | Status: DC | PRN

## 2020-07-17 MED ORDER — LACTATED RINGERS IV SOLN: INTRAVENOUS | Status: DC | PRN

## 2020-07-17 MED ORDER — DEXAMETHASONE SODIUM PHOSPHATE 10 MG/ML IJ SOLN
INTRAMUSCULAR | Status: DC | PRN
  Administered 2020-07-17: 5 mg via INTRAVENOUS

## 2020-07-17 MED ORDER — BUPIVACAINE LIPOSOME 1.3 % IJ SUSP
INTRAMUSCULAR | Status: DC | PRN
  Administered 2020-07-17: 10 mL

## 2020-07-17 MED ORDER — BUPIVACAINE LIPOSOME 1.3 % IJ SUSP
INTRAMUSCULAR | Status: AC
  Filled 2020-07-17: qty 10

## 2020-07-17 MED ORDER — POLYETHYLENE GLYCOL 3350 17 G PO PACK
17.0000 g | PACK | Freq: Every day | ORAL | Status: DC
  Administered 2020-07-17 – 2020-07-18 (×2): 17 g via ORAL
  Filled 2020-07-17 (×2): qty 1

## 2020-07-17 MED ORDER — CELECOXIB 100 MG PO CAPS
200.0000 mg | ORAL_CAPSULE | Freq: Two times a day (BID) | ORAL | Status: DC
  Administered 2020-07-17 – 2020-07-18 (×2): 200 mg via ORAL
  Filled 2020-07-17 (×3): qty 2

## 2020-07-17 MED ORDER — 0.9 % SODIUM CHLORIDE (POUR BTL) OPTIME
TOPICAL | Status: DC | PRN
  Administered 2020-07-17: 1000 mL

## 2020-07-17 MED ORDER — BUPIVACAINE LIPOSOME 1.3 % IJ SUSP
INTRAMUSCULAR | Status: AC
  Filled 2020-07-17: qty 20

## 2020-07-17 MED ORDER — BICALUTAMIDE 50 MG PO TABS
50.0000 mg | ORAL_TABLET | Freq: Every day | ORAL | Status: DC
  Administered 2020-07-18: 50 mg via ORAL
  Filled 2020-07-17 (×3): qty 1

## 2020-07-17 MED ORDER — ROPIVACAINE HCL 5 MG/ML IJ SOLN
INTRAMUSCULAR | Status: AC
  Filled 2020-07-17: qty 30

## 2020-07-17 MED ORDER — BUPIVACAINE-EPINEPHRINE (PF) 0.5% -1:200000 IJ SOLN
INTRAMUSCULAR | Status: AC
  Filled 2020-07-17: qty 30

## 2020-07-17 MED ORDER — HYDROMORPHONE HCL 1 MG/ML IJ SOLN: 0.2500 mg | INTRAMUSCULAR | Status: DC | PRN

## 2020-07-17 MED ORDER — PROPOFOL 10 MG/ML IV BOLUS
INTRAVENOUS | Status: AC
  Filled 2020-07-17: qty 20

## 2020-07-17 MED ORDER — SODIUM CHLORIDE 0.9 % IV SOLN: INTRAVENOUS | Status: DC

## 2020-07-17 MED ORDER — METOCLOPRAMIDE HCL 5 MG/ML IJ SOLN: 5.0000 mg | Freq: Three times a day (TID) | INTRAMUSCULAR | Status: DC | PRN

## 2020-07-17 MED ORDER — ALUM & MAG HYDROXIDE-SIMETH 200-200-20 MG/5ML PO SUSP: 30.0000 mL | ORAL | Status: DC | PRN

## 2020-07-17 MED ORDER — PROPOFOL 500 MG/50ML IV EMUL
INTRAVENOUS | Status: DC | PRN
  Administered 2020-07-17: 75 ug/kg/min via INTRAVENOUS

## 2020-07-17 MED ORDER — LACTATED RINGERS IV SOLN
Freq: Once | INTRAVENOUS | Status: AC
  Administered 2020-07-17: 1000 mL via INTRAVENOUS

## 2020-07-17 MED ORDER — LISINOPRIL 10 MG PO TABS
10.0000 mg | ORAL_TABLET | Freq: Every day | ORAL | Status: DC
  Administered 2020-07-18: 10 mg via ORAL
  Filled 2020-07-17 (×2): qty 1

## 2020-07-17 MED ORDER — DEXAMETHASONE SODIUM PHOSPHATE 4 MG/ML IJ SOLN
INTRAMUSCULAR | Status: AC
  Filled 2020-07-17: qty 2

## 2020-07-17 MED ORDER — ACETAMINOPHEN 500 MG PO TABS
500.0000 mg | ORAL_TABLET | Freq: Once | ORAL | Status: AC
  Administered 2020-07-17: 500 mg via ORAL
  Filled 2020-07-17: qty 1

## 2020-07-17 MED ORDER — DEXAMETHASONE SODIUM PHOSPHATE 10 MG/ML IJ SOLN
10.0000 mg | Freq: Once | INTRAMUSCULAR | Status: DC
  Filled 2020-07-17: qty 1

## 2020-07-17 MED ORDER — MENTHOL 3 MG MT LOZG: 1.0000 | LOZENGE | OROMUCOSAL | Status: DC | PRN

## 2020-07-17 MED ORDER — LIDOCAINE HCL (PF) 1 % IJ SOLN
INTRAMUSCULAR | Status: AC
  Filled 2020-07-17: qty 30

## 2020-07-17 MED ORDER — CEFAZOLIN SODIUM-DEXTROSE 2-4 GM/100ML-% IV SOLN
2.0000 g | INTRAVENOUS | Status: AC
  Administered 2020-07-17: 2 g via INTRAVENOUS
  Filled 2020-07-17: qty 100

## 2020-07-17 MED ORDER — METOCLOPRAMIDE HCL 10 MG PO TABS: 5.0000 mg | ORAL_TABLET | Freq: Three times a day (TID) | ORAL | Status: DC | PRN

## 2020-07-17 MED ORDER — PHENOL 1.4 % MT LIQD: 1.0000 | OROMUCOSAL | Status: DC | PRN

## 2020-07-17 MED ORDER — GABAPENTIN 100 MG PO CAPS
100.0000 mg | ORAL_CAPSULE | Freq: Two times a day (BID) | ORAL | Status: DC
  Administered 2020-07-17 – 2020-07-18 (×2): 100 mg via ORAL
  Filled 2020-07-17 (×2): qty 1

## 2020-07-17 MED ORDER — DIPHENHYDRAMINE HCL 12.5 MG/5ML PO ELIX: 12.5000 mg | ORAL_SOLUTION | ORAL | Status: DC | PRN

## 2020-07-17 MED ORDER — HYDROCODONE-ACETAMINOPHEN 5-325 MG PO TABS
1.0000 | ORAL_TABLET | ORAL | Status: DC | PRN
  Administered 2020-07-17 – 2020-07-18 (×2): 1 via ORAL
  Filled 2020-07-17 (×2): qty 1

## 2020-07-17 MED ORDER — SODIUM CHLORIDE 0.9 % IR SOLN
Status: DC | PRN
  Administered 2020-07-17: 3000 mL

## 2020-07-17 MED ORDER — MORPHINE SULFATE (PF) 2 MG/ML IV SOLN
0.5000 mg | INTRAVENOUS | Status: DC | PRN
  Administered 2020-07-17 (×2): 1 mg via INTRAVENOUS
  Filled 2020-07-17 (×2): qty 1

## 2020-07-17 MED ORDER — CHLORHEXIDINE GLUCONATE 0.12 % MT SOLN
15.0000 mL | Freq: Once | OROMUCOSAL | Status: AC
  Administered 2020-07-17: 15 mL via OROMUCOSAL
  Filled 2020-07-17: qty 15

## 2020-07-17 MED ORDER — AMLODIPINE BESYLATE 5 MG PO TABS
5.0000 mg | ORAL_TABLET | Freq: Every day | ORAL | Status: DC
  Administered 2020-07-18: 5 mg via ORAL
  Filled 2020-07-17 (×2): qty 1

## 2020-07-17 MED ORDER — ONDANSETRON HCL 4 MG/2ML IJ SOLN
INTRAMUSCULAR | Status: DC | PRN
  Administered 2020-07-17: 4 mg via INTRAVENOUS

## 2020-07-17 MED ORDER — PANTOPRAZOLE SODIUM 40 MG PO TBEC
40.0000 mg | DELAYED_RELEASE_TABLET | Freq: Every day | ORAL | Status: DC
  Administered 2020-07-17 – 2020-07-18 (×2): 40 mg via ORAL
  Filled 2020-07-17 (×2): qty 1

## 2020-07-17 MED ORDER — BISACODYL 5 MG PO TBEC: 5.0000 mg | DELAYED_RELEASE_TABLET | Freq: Every day | ORAL | Status: DC | PRN

## 2020-07-17 MED ORDER — LIDOCAINE HCL (PF) 1 % IJ SOLN
INTRAMUSCULAR | Status: DC | PRN
  Administered 2020-07-17: 3 mL

## 2020-07-17 MED ORDER — IBUPROFEN 400 MG PO TABS
400.0000 mg | ORAL_TABLET | Freq: Once | ORAL | Status: AC
  Administered 2020-07-17: 400 mg via ORAL
  Filled 2020-07-17: qty 1

## 2020-07-17 MED ORDER — BUPIVACAINE IN DEXTROSE 0.75-8.25 % IT SOLN
INTRATHECAL | Status: DC | PRN
Start: 2020-07-17 — End: 2020-07-17
  Administered 2020-07-17: 1.7 mL via INTRATHECAL

## 2020-07-17 MED ORDER — DEXAMETHASONE SODIUM PHOSPHATE 4 MG/ML IJ SOLN
INTRAMUSCULAR | Status: DC | PRN
  Administered 2020-07-17: 8 mg via PERINEURAL

## 2020-07-17 MED ORDER — CHLORHEXIDINE GLUCONATE CLOTH 2 % EX PADS
6.0000 | MEDICATED_PAD | Freq: Every day | CUTANEOUS | Status: DC
  Administered 2020-07-17: 6 via TOPICAL

## 2020-07-17 MED ORDER — PROPOFOL 10 MG/ML IV BOLUS
INTRAVENOUS | Status: AC
  Filled 2020-07-17: qty 40

## 2020-07-17 MED ORDER — MIDAZOLAM HCL 2 MG/2ML IJ SOLN
2.0000 mg | Freq: Once | INTRAMUSCULAR | Status: AC
  Administered 2020-07-17: 1 mg via INTRAVENOUS
  Filled 2020-07-17: qty 2

## 2020-07-17 MED ORDER — ACETAMINOPHEN 500 MG PO TABS
500.0000 mg | ORAL_TABLET | Freq: Four times a day (QID) | ORAL | Status: AC
  Administered 2020-07-17 – 2020-07-18 (×4): 500 mg via ORAL
  Filled 2020-07-17 (×4): qty 1

## 2020-07-17 SURGICAL SUPPLY — 77 items
ATTUNE MED DOME PAT 41 KNEE (Knees) ×1 IMPLANT
ATTUNE MED DOME PAT 41MM KNEE (Knees) ×1 IMPLANT
ATTUNE PS FEM LT SZ 7 CEM KNEE (Femur) ×3 IMPLANT
BANDAGE ELASTIC 4 VELCRO NS (GAUZE/BANDAGES/DRESSINGS) ×4 IMPLANT
BANDAGE ELASTIC 6 VELCRO NS (GAUZE/BANDAGES/DRESSINGS) ×2 IMPLANT
BANDAGE ESMARK 6X9 LF (GAUZE/BANDAGES/DRESSINGS) ×1 IMPLANT
BASE TIBIAL CEM ATTUNE SZ 7 (Knees) ×3 IMPLANT
BASEPLATE TIB CEM ATTUNE SZ7 (Knees) IMPLANT
BLADE HEX COATED 2.75 (ELECTRODE) ×3 IMPLANT
BLADE SAGITTAL 25.0X1.27X90 (BLADE) ×2 IMPLANT
BLADE SAGITTAL 25.0X1.27X90MM (BLADE) ×1
BLADE SAW SGTL 11.0X1.19X90.0M (BLADE) ×3 IMPLANT
BNDG CMPR 9X6 STRL LF SNTH (GAUZE/BANDAGES/DRESSINGS) ×1
BNDG CMPR STD VLCR NS LF 5.8X4 (GAUZE/BANDAGES/DRESSINGS) ×2
BNDG CMPR STD VLCR NS LF 5.8X6 (GAUZE/BANDAGES/DRESSINGS) ×1
BNDG ELASTIC 4X5.8 VLCR NS LF (GAUZE/BANDAGES/DRESSINGS) ×6 IMPLANT
BNDG ELASTIC 6X5.8 VLCR NS LF (GAUZE/BANDAGES/DRESSINGS) ×3 IMPLANT
BNDG ESMARK 6X9 LF (GAUZE/BANDAGES/DRESSINGS) ×3
BSPLAT TIB 7 CMNT FX BRNG STRL (Knees) ×1 IMPLANT
CEMENT HV SMART SET (Cement) ×6 IMPLANT
CLOTH BEACON ORANGE TIMEOUT ST (SAFETY) ×3 IMPLANT
COOLER ICEMAN CLASSIC (MISCELLANEOUS) ×3 IMPLANT
COVER LIGHT HANDLE STERIS (MISCELLANEOUS) ×6 IMPLANT
COVER WAND RF STERILE (DRAPES) ×3 IMPLANT
CUFF TOURN SGL QUICK 34 (TOURNIQUET CUFF) ×3
CUFF TRNQT CYL 34X4.125X (TOURNIQUET CUFF) ×1 IMPLANT
DRAPE BACK TABLE (DRAPES) ×3 IMPLANT
DRAPE EXTREMITY T 121X128X90 (DISPOSABLE) ×3 IMPLANT
DRSG MEPILEX BORDER 4X12 (GAUZE/BANDAGES/DRESSINGS) ×3 IMPLANT
DURAPREP 26ML APPLICATOR (WOUND CARE) ×6 IMPLANT
ELECT REM PT RETURN 9FT ADLT (ELECTROSURGICAL) ×3
ELECTRODE REM PT RTRN 9FT ADLT (ELECTROSURGICAL) ×1 IMPLANT
GLOVE BIO SURGEON STRL SZ7 (GLOVE) ×4 IMPLANT
GLOVE BIOGEL PI IND STRL 7.0 (GLOVE) ×2 IMPLANT
GLOVE BIOGEL PI INDICATOR 7.0 (GLOVE) ×4
GLOVE ECLIPSE 7.0 STRL STRAW (GLOVE) ×3 IMPLANT
GLOVE SKINSENSE NS SZ8.0 LF (GLOVE) ×4
GLOVE SKINSENSE STRL SZ8.0 LF (GLOVE) ×2 IMPLANT
GLOVE SS N UNI LF 8.5 STRL (GLOVE) ×3 IMPLANT
GOWN STRL REUS W/TWL LRG LVL3 (GOWN DISPOSABLE) ×9 IMPLANT
GOWN STRL REUS W/TWL XL LVL3 (GOWN DISPOSABLE) ×3 IMPLANT
HANDPIECE INTERPULSE COAX TIP (DISPOSABLE) ×3
HOOD W/PEELAWAY (MISCELLANEOUS) ×12 IMPLANT
INSERT TIB ATTUNE FB SZ7X5 (Insert) ×2 IMPLANT
INST SET MAJOR BONE (KITS) ×3 IMPLANT
IV NS IRRIG 3000ML ARTHROMATIC (IV SOLUTION) ×3 IMPLANT
KIT BLADEGUARD II DBL (SET/KITS/TRAYS/PACK) ×3 IMPLANT
KIT TURNOVER KIT A (KITS) ×3 IMPLANT
MANIFOLD NEPTUNE II (INSTRUMENTS) ×3 IMPLANT
MARKER SKIN DUAL TIP RULER LAB (MISCELLANEOUS) ×3 IMPLANT
NDL HYPO 18GX1.5 BLUNT FILL (NEEDLE) IMPLANT
NDL HYPO 21X1.5 SAFETY (NEEDLE) IMPLANT
NEEDLE HYPO 18GX1.5 BLUNT FILL (NEEDLE) ×3 IMPLANT
NEEDLE HYPO 21X1.5 SAFETY (NEEDLE) ×3 IMPLANT
NS IRRIG 1000ML POUR BTL (IV SOLUTION) ×3 IMPLANT
PACK TOTAL JOINT (CUSTOM PROCEDURE TRAY) ×3 IMPLANT
PAD ARMBOARD 7.5X6 YLW CONV (MISCELLANEOUS) ×3 IMPLANT
PAD COLD SHLDR WRAP-ON (PAD) ×2 IMPLANT
PENCIL SMOKE EVACUATOR (MISCELLANEOUS) ×3 IMPLANT
PILLOW KNEE EXTENSION 0 DEG (MISCELLANEOUS) ×3 IMPLANT
PIN THREADED HEADED SIGMA (PIN) ×3 IMPLANT
PIN/DRILL PACK ORTHO 1/8X3.0 (PIN) ×3 IMPLANT
SAW OSC TIP CART 19.5X105X1.3 (SAW) ×3 IMPLANT
SET BASIN LINEN APH (SET/KITS/TRAYS/PACK) ×3 IMPLANT
SET HNDPC FAN SPRY TIP SCT (DISPOSABLE) ×1 IMPLANT
STAPLER VISISTAT 35W (STAPLE) ×3 IMPLANT
SUT BRALON NAB BRD #1 30IN (SUTURE) ×3 IMPLANT
SUT MNCRL 0 VIOLET CTX 36 (SUTURE) ×1 IMPLANT
SUT MON AB 0 CT1 (SUTURE) ×3 IMPLANT
SUT MONOCRYL 0 CTX 36 (SUTURE) ×3
SYR 20ML LL LF (SYRINGE) ×4 IMPLANT
SYR BULB IRRIG 60ML STRL (SYRINGE) ×3 IMPLANT
TOWEL OR 17X26 4PK STRL BLUE (TOWEL DISPOSABLE) ×3 IMPLANT
TOWER CARTRIDGE SMART MIX (DISPOSABLE) ×3 IMPLANT
TRAY FOLEY MTR SLVR 16FR STAT (SET/KITS/TRAYS/PACK) ×3 IMPLANT
WATER STERILE IRR 1000ML POUR (IV SOLUTION) ×6 IMPLANT
YANKAUER SUCT 12FT TUBE ARGYLE (SUCTIONS) ×3 IMPLANT

## 2020-07-17 NOTE — Anesthesia Procedure Notes (Signed)
Anesthesia Regional Block: Adductor canal block   Pre-Anesthetic Checklist: ,, timeout performed, Correct Patient, Correct Site, Correct Laterality, Correct Procedure, Correct Position, site marked, Risks and benefits discussed,  Surgical consent,  Pre-op evaluation,  At surgeon's request and post-op pain management  Laterality: Left  Prep: Betadine       Needles:  Injection technique: Single-shot  Needle Type: Echogenic Stimulator Needle     Needle Length: 10cm  Needle Gauge: 20   Needle insertion depth: 6 cm   Additional Needles:   Procedures:,,,, ultrasound used (permanent image in chart),,,,  Narrative:  Start time: 07/17/2020 7:17 AM End time: 07/17/2020 7:28 AM Injection made incrementally with aspirations every 5 mL.  Performed by: Personally  Anesthesiologist: Denese Killings, MD  Additional Notes: BP cuff, EKG monitors applied. Sedation begun. Femoral artery palpated for location of nerve. After nerve location anesthetic injected incrementally, slowly , and after neg aspirations. Tolerated well.

## 2020-07-17 NOTE — Transfer of Care (Signed)
Immediate Anesthesia Transfer of Care Note  Patient: Jose Freeman  Procedure(s) Performed: TOTAL KNEE ARTHROPLASTY (Left Knee)  Patient Location: PACU  Anesthesia Type:Spinal  Level of Consciousness: awake, alert , oriented and patient cooperative  Airway & Oxygen Therapy: Patient Spontanous Breathing and Patient connected to nasal cannula oxygen  Post-op Assessment: Report given to RN and Post -op Vital signs reviewed and stable  Post vital signs: Reviewed and stable  Last Vitals:  Vitals Value Taken Time  BP    Temp    Pulse    Resp    SpO2      Last Pain:  Vitals:   07/17/20 0659  TempSrc: Oral  PainSc: 0-No pain         Complications: No complications documented.

## 2020-07-17 NOTE — Evaluation (Signed)
Physical Therapy Evaluation Patient Details Name: Jose Freeman MRN: [id number] DOB: 03-06-1934 Today's Date: 07/17/2020   LEFT KNEE ROM:  0 - 90 degrees AMBULATION DISTANCE: 35 feet using RW with Minimal assistance     History of Present Illness  Jose Freeman is a 86 y/o male, s/p Left TKA on 07/17/20 with the diagnosis of left knee osteoarthritis.  Clinical Impression  Patient demonstrates good return for moving LLE during supine<>sitting, had difficulty completing sit to stands due to left knee pain/weakness, able to ambulate into hallway, but limited due to c/o fatigue and requested to go back to bed after therapy.  Patient will benefit from continued physical therapy in hospital and recommended venue below to increase strength, balance, endurance for safe ADLs and gait.    Follow Up Recommendations Home health PT;Supervision for mobility/OOB;Supervision - Intermittent    Equipment Recommendations  None recommended by PT    Recommendations for Other Services       Precautions / Restrictions Precautions Precautions: Fall Precaution Comments: s/p Left TKA Restrictions Weight Bearing Restrictions: Yes LLE Weight Bearing: Weight bearing as tolerated      Mobility  Bed Mobility Overal bed mobility: Needs Assistance Bed Mobility: Supine to Sit;Sit to Supine     Supine to sit: Min guard Sit to supine: Supervision   General bed mobility comments: increased time, labored movement, good return for moving LLE  Transfers Overall transfer level: Needs assistance Equipment used: Rolling walker (2 wheeled) Transfers: Sit to/from [facility] Sit to Stand: Min assist Stand pivot transfers: Min guard       General transfer comment: had difficulty with sit to stands due to LLE weakness  Ambulation/Gait Ambulation/Gait assistance: Min assist;Min guard Gait Distance (Feet): 35 Feet Assistive device: Rolling walker (2 wheeled) Gait Pattern/deviations:  Decreased step length - right;Decreased step length - left;Decreased stance time - left;Decreased stride length;Antalgic Gait velocity: decreased   General Gait Details: slow labored cadence with fair return for left heel to toe stepping, limited secondary to c/o fatigue and increasing pain left knee  Stairs            Wheelchair Mobility    Modified Rankin (Stroke Patients Only)       Balance Overall balance assessment: Needs assistance Sitting-balance support: Feet supported;No upper extremity supported Sitting balance-Leahy Scale: Good Sitting balance - Comments: seated at EOB   Standing balance support: During functional activity;Bilateral upper extremity supported Standing balance-Leahy Scale: Fair Standing balance comment: using RW                             Pertinent Vitals/Pain Pain Assessment: 0-10 Pain Score: 3  Pain Location: left knee Pain Descriptors / Indicators: Sore Pain Intervention(s): Limited activity within patient's tolerance;Monitored during session;Premedicated before session    Home Living Family/patient expects to be discharged to:: Private residence Living Arrangements: Alone Available Help at Discharge: Family;Available 24 hours/day Type of Home: House Home Access: Stairs to enter Entrance Stairs-Rails: None Entrance Stairs-Number of Steps: 2 Home Layout: One level Home Equipment: Walker - 2 wheels;Cane - single point;Shower seat;Bedside commode      Prior Function Level of Independence: Independent with assistive device(s)         Comments: household and short distanced community ambulator with [facility]     Hand Dominance   Dominant Hand: Left    Extremity/Trunk Assessment   Upper Extremity Assessment Upper Extremity Assessment: Overall WFL for tasks assessed  Lower Extremity Assessment Lower Extremity Assessment: Overall WFL for tasks assessed;LLE deficits/detail LLE Deficits / Details: grossly -4/5 LLE:  Unable to fully assess due to pain LLE Sensation: WNL LLE Coordination: WNL    Cervical / Trunk Assessment Cervical / Trunk Assessment: Normal  Communication   Communication: No difficulties  Cognition Arousal/Alertness: Awake/alert Behavior During Therapy: WFL for tasks assessed/performed Overall Cognitive Status: Within Functional Limits for tasks assessed                                        General Comments      Exercises Total Joint Exercises Ankle Circles/Pumps: Supine;5 reps;Left;Strengthening;AROM [employer] Sets: Supine;5 reps;Strengthening;Left;AROM Short Arc Quad: Supine;5 reps;Left;Strengthening;AROM Heel Slides: Supine;5 reps;Left;Strengthening;AROM   Assessment/Plan    PT Assessment Patient needs continued PT services  PT Problem List Decreased strength;Decreased activity tolerance;Decreased mobility;Decreased balance       PT Treatment Interventions Gait training;DME instruction;Stair training;Functional mobility training;Therapeutic activities;Therapeutic exercise;Patient/family education;Balance training    PT Goals (Current goals can be found in the Care Plan section)  Acute Rehab PT Goals Patient Stated Goal: return home with family to assist PT Goal Formulation: With patient/family Time For Goal Achievement: 07/19/20 Potential to Achieve Goals: Good    Frequency BID   Barriers to discharge        Co-evaluation               AM-PAC PT "6 Clicks" Mobility  Outcome Measure Help needed turning from your back to your side while in a flat bed without using bedrails?: None Help needed moving from lying on your back to sitting on the side of a flat bed without using bedrails?: A Little Help needed moving to and from a bed to a chair (including a wheelchair)?: A Little Help needed standing up from a chair using your arms (e.g., wheelchair or bedside chair)?: A Lot Help needed to walk in hospital room?: A Little Help needed climbing  3-5 steps with a railing? : A Lot 6 Click Score: 17    End of Session   Activity Tolerance: Patient tolerated treatment well;Patient limited by fatigue Patient left: in bed;with call bell/phone within reach;with family/visitor present Nurse Communication: Mobility status PT Visit Diagnosis: Unsteadiness on feet (R26.81);Other abnormalities of gait and mobility (R26.89);Muscle weakness (generalized) (M62.81)    Time: 5974-1638 PT Time Calculation (min) (ACUTE ONLY): 31 min   Charges:   PT Evaluation $PT Eval Moderate Complexity: 1 Mod PT Treatments $Therapeutic Activity: 23-37 mins        4:24 PM, 07/17/20 Lonell Grandchild, MPT Physical Therapist with [facility] [phone] office [phone] mobile phone

## 2020-07-17 NOTE — Addendum Note (Signed)
Addendum  created 07/17/20 1431 by Denese Killings, MD   Charge Capture section accepted

## 2020-07-17 NOTE — TOC Initial Note (Signed)
Transition of Care [facility]) - Initial/Assessment Note   Patient Details  Name: Jose Freeman MRN: [id number] Date of Birth: 04-07-1934  [facility] CM/SW Contact:    Sherie Don, LCSW Phone Number: 07/17/2020, 4:17 PM  Clinical Narrative: Patient is an 86 year old male who is under observation following total knee replacement. TOC received consult for HH/DME needs. CSW spoke with patient's daughter, Jose Freeman, regarding DME needs. Per daughter, patient lives alone and will be staying with her post-surgery so he will need a shower chair. Patient currently has a walker, cane, and raised toilet seat at home. CSW informed daughter patient has been referred to [facility]. Awaiting PT consult. TOC to follow.  Expected Discharge Plan: Skilled Nursing Facility Barriers to Discharge: Continued Medical Work up  Patient Goals and CMS Choice Patient states their goals for this hospitalization and ongoing recovery are:: Discharge to daughter's home w/HH CMS Medicare.gov Compare Post Acute Care list provided to:: Patient Represenative (must comment) Jose Freeman (daughter)) Choice offered to / list presented to : Adult Children  Expected Discharge Plan and Services Expected Discharge Plan: [place] In-house Referral: Clinical Social Work Discharge Planning Services: NA Post Acute Care Choice: Home Health Living arrangements for the past 2 months: [place]: RN, PT HH Agency: [facility] [employer])  Prior Living Arrangements/Services Living arrangements for the past 2 months: [place] with:: Self Patient language and need for interpreter reviewed:: Yes Do you feel safe going back to the place where you live?: Yes      Need for Family Participation in Patient Care: Yes (Comment) (Patient will live with daughter post-surgery) Care giver support system in place?: Yes (comment) Current home services: DME  (Walker, cane, raised toilet seat) Criminal Activity/Legal Involvement Pertinent to Current Situation/Hospitalization: No - Comment as needed  Permission Sought/Granted Permission sought to share information with : Chartered certified accountant granted to share info w AGENCY: DME providers  Emotional Assessment Appearance:: Appears stated age Orientation: : Oriented to Self, Oriented to Place, Oriented to  Time, Oriented to Situation Alcohol / Substance Use: Not Applicable Psych Involvement: No (comment)  Admission diagnosis:  H/O total knee replacement [Z96.659] Patient Active Problem List   Diagnosis Date Noted  . H/O total knee replacement 07/17/2020  . Primary osteoarthritis of left knee    PCP:  Monico Blitz, MD Pharmacy:   [place] [place] [address] [place] [address] Phone: [phone] Fax: [phone]  Readmission Risk Interventions No flowsheet data found.

## 2020-07-17 NOTE — Interval H&P Note (Signed)
History and Physical Interval Note:  07/17/2020 7:28 AM  Jose Freeman  has presented today for surgery, with the diagnosis of left knee osteoarthritis.  The various methods of treatment have been discussed with the patient and family. After consideration of risks, benefits and other options for treatment, the patient has consented to  Procedure(s): TOTAL KNEE ARTHROPLASTY (Left) as a surgical intervention.  The patient's history has been reviewed, patient examined, no change in status, stable for surgery.  I have reviewed the patient's chart and labs.  Questions were answered to the patient's satisfaction.    CBC Latest Ref Rng & Units 07/13/2020  WBC 4.0 - 10.5 K/uL 9.1  Hemoglobin 13.0 - 17.0 g/dL 15.3  Hematocrit 39 - 52 % 44.1  Platelets 150 - 400 K/uL [place]

## 2020-07-17 NOTE — Anesthesia Preprocedure Evaluation (Addendum)
Anesthesia Evaluation  Patient identified by MRN, date of birth, ID band Patient awake    Reviewed: Allergy & Precautions, NPO status , Patient's Chart, lab work & pertinent test results  History of Anesthesia Complications Negative for: history of anesthetic complications  Airway Mallampati: II  TM Distance: >3 FB Neck ROM: Full    Dental  (+) Missing, Partial Lower   Pulmonary former smoker,    Pulmonary exam normal breath sounds clear to auscultation       Cardiovascular METS: 3 - Mets hypertension, Pt. on medications Normal cardiovascular exam Rhythm:Regular Rate:Normal  13-Jul-2020 13:45:48 [place] [facility] ROUTINE RECORD Normal sinus rhythm Left anterior fascicular block Left ventricular hypertrophy with repolarization abnormality U waves present Abnormal ECG No old tracing to compare Confirmed by Glori Bickers [id number]) on 07/13/2020 11:53:47 PM 56mm/s 28mm/mV 100Hz  9.0.4 12SL 243 CID: [id number] Referred by: Monico Blitz Confirmed By: Glori Bickers   Neuro/Psych negative neurological ROS  negative psych ROS   GI/Hepatic Neg liver ROS, Ex lap for bowel perforation   Endo/Other  negative endocrine ROS  Renal/GU negative Renal ROS   Prostate cancer    Musculoskeletal  (+) Arthritis  (laminectomy), Osteoarthritis,    Abdominal   Peds  Hematology negative hematology ROS (+)   Anesthesia Other Findings   Reproductive/Obstetrics                         Anesthesia Physical Anesthesia Plan  ASA: II  Anesthesia Plan: Spinal and General   Post-op Pain Management:    Induction:   PONV Risk Score and Plan: 3 and Ondansetron and Dexamethasone  Airway Management Planned: Nasal Cannula, Natural Airway and Simple Face Mask  Additional Equipment:   Intra-op Plan:   Post-operative Plan:   Informed Consent: I have reviewed the patients History and Physical, chart,  labs and discussed the procedure including the risks, benefits and alternatives for the proposed anesthesia with the patient or authorized representative who has indicated his/her understanding and acceptance.     Dental advisory given  Plan Discussed with: CRNA and Surgeon  Anesthesia Plan Comments: (Left adductor canal block, spinal anesthesia for the procedure)       Anesthesia Quick Evaluation

## 2020-07-17 NOTE — Anesthesia Procedure Notes (Signed)
Spinal  Patient location during procedure: OR Start time: 07/17/2020 7:42 AM End time: 07/17/2020 7:50 AM Staffing Performed: anesthesiologist  Anesthesiologist: Denese Killings, MD Preanesthetic Checklist Completed: patient identified, IV checked, site marked, risks and benefits discussed, surgical consent, monitors and equipment checked, pre-op evaluation and timeout performed Spinal Block Patient position: sitting Prep: DuraPrep Patient monitoring: heart rate, cardiac monitor, continuous pulse ox and blood pressure Approach: midline Location: L3-4 Injection technique: single-shot Needle Needle type: Spinocan  Needle gauge: 24 G Needle length: 9 cm Needle insertion depth: 5 cm Assessment Sensory level: T10

## 2020-07-17 NOTE — Plan of Care (Signed)
  Problem: Acute Rehab PT Goals(only PT should resolve) Goal: Pt Will Go Supine/Side To Sit Outcome: Progressing Flowsheets (Taken 07/17/2020 1625) Pt will go Supine/Side to Sit: with modified independence Goal: Patient Will Transfer Sit To/From Stand Outcome: Progressing Flowsheets (Taken 07/17/2020 1625) Patient will transfer sit to/from stand: with supervision Goal: Pt Will Transfer Bed To Chair/Chair To Bed Outcome: Progressing Flowsheets (Taken 07/17/2020 1625) Pt will Transfer Bed to Chair/Chair to Bed: with supervision Goal: Pt Will Ambulate Outcome: Progressing Flowsheets (Taken 07/17/2020 1625) Pt will Ambulate:  100 feet  with supervision  with modified independence  with rolling walker   4:26 PM, 07/17/20 Lonell Grandchild, MPT Physical Therapist with [facility] [phone] office [phone] mobile phone

## 2020-07-17 NOTE — Progress Notes (Signed)
Glasses on pt.

## 2020-07-17 NOTE — Anesthesia Postprocedure Evaluation (Signed)
Anesthesia Post Note  Patient: ABDULAHAD Freeman  Procedure(s) Performed: TOTAL KNEE ARTHROPLASTY (Left Knee)  Patient location during evaluation: PACU Anesthesia Type: Spinal Level of consciousness: awake, oriented, awake and alert and patient cooperative Pain management: satisfactory to patient Vital Signs Assessment: post-procedure vital signs reviewed and stable Respiratory status: spontaneous breathing, respiratory function stable, nonlabored ventilation and patient connected to nasal cannula oxygen Cardiovascular status: stable Postop Assessment: no apparent nausea or vomiting Anesthetic complications: no   No complications documented.   Last Vitals:  Vitals:   07/17/20 0659  BP: (!) 151/89  Resp: 20  Temp: 36.7 C  SpO2: 94%    Last Pain:  Vitals:   07/17/20 0659  TempSrc: Oral  PainSc: 0-No pain                 Daesha Insco

## 2020-07-17 NOTE — Brief Op Note (Signed)
07/17/2020  9:54 AM  PATIENT:  Jose Freeman  86 y.o. male  PRE-OPERATIVE DIAGNOSIS:  left knee osteoarthritis  POST-OPERATIVE DIAGNOSIS:  left knee osteoarthritis  PROCEDURE:  Procedure(s): TOTAL KNEE ARTHROPLASTY (Left)   Findings the patient had varus knee moderate grade 4 wear on the medial side deficient ACL grade 4 wear on the lateral femoral condyle especially on the medial half of it and then mild patellofemoral disease   Implants DePuy J&J attune 7 femur, 7 tibia, 5 polyethylene insert, 41 patella  Bone cuts Femur 11 Tibia 3 from the medial side reference Patella 25-14  Assisted by Fulton Mole  Preoperative saphenous nerve block   SURGEON:  Surgeon(s) and Role:    [name], MD - Primary   ANESTHESIA:   Spinal and general EBL:  50 mL   BLOOD ADMINISTERED:none  DRAINS: none   LOCAL MEDICATIONS USED:  OTHER 10 cc of exparel injected into the quadriceps and patellar tendon and medial soft tissue sleeve  SPECIMEN:  No Specimen  DISPOSITION OF SPECIMEN:  N/A  COUNTS:  YES  TOURNIQUET:   Total Tourniquet Time Documented: Thigh (Left) - 80 minutes Total: Thigh (Left) - 80 minutes   DICTATION: .Viviann Spare Dictation  PLAN OF CARE: Admit to inpatient   PATIENT DISPOSITION:  PACU - hemodynamically stable.   Delay start of Pharmacological VTE agent (>24hrs) due to surgical blood loss or risk of bleeding: yes  Details of the surgery  The patient was seen in the preop area the surgical site was confirmed marked chart review completed x-rays reviewed implants confirmed to be in the building and available  Patient was taken to the operating room for anesthesia where he then had a attempted Foley catheter insertion which was unsuccessful.  We dilated and up to a size 16 and then placed a 18 and then placed 18 catheter.  The urethral mitis was very hard to visualize.  We got back good urine no blood some sediment  We proceeded to prep and drape the  left leg sterilely  After that timeout was completed site was confirmed all preoperative medicines including antibiotics and transxemic acid was given  The limb was exsanguinated with 4 inch Esmarch tourniquet was elevated 3 mmHg  Midline incision was made subcutaneous tissue was divided medial arthrotomy was performed patella was everted laterally knee was placed in flexion anterior medial meniscus and anterior lateral meniscus was excised patella fat pad excised ACL was deficient  PCL was removed with an osteotome and a rondure  Three 8 inch drill bit was used and the femoral canal the canal was suctioned and irrigated  We set the distal femur for a 5 degree left 11 mm cut.  After this cut was made we turned our attention to the tibia  Tibial alignment guide was set for neutral tibial cut in terms of varus valgus we reference the lower medial side with a 3 mm stylus.  We set the slope for 5 degrees  After the guide was set we removed the proximal tibia it measured a size 7.  We then did a extension gap check with a 5 mm insert we obtained balance with a 5 mm insert.  We then turned our attention back to the femur we measured the femur to be a 6-1/2.  We chose a 7 reference point with appropriate external rotation per patient anatomy  We pinned the block and made the 4 distal femoral cuts checking the flexion gap cut with the  5 spacer block prior to cutting it  It was fine and balanced we proceeded with the cuts as stated  We then checked the flexion extension gaps again a 5 mm block gave excellent balance in flexion and extension  We cut the notch with a size 4 guide  We then prepared the proximal tibia  We measured the patella to be 25.5 and then resected down to a 14 we placed a 41 mm button  We did a trial reduction we got excellent flexion with a passive flexion test of 100 degrees and no liftoff up to 125 degrees full extension noted  All trial implants were removed the  knee was irrigated and the bone was dried  Implants were cemented and excess cement was removed second irrigation was performed repeat range of motion test showed good stability in flexion extension excellent flexion obtained 125 degrees  Final irrigation was performed closure was performed with #1 Braylon in running fashion 2 layers of 0 Monocryl staples were applied  Dressing applied with Cryo/Cuff  Patient taken recovery in stable condition

## 2020-07-17 NOTE — Progress Notes (Signed)
Pt had previous injury to hand and has limited use

## 2020-07-17 NOTE — Op Note (Signed)
07/17/2020  9:54 AM  PATIENT:  Jose Freeman  86 y.o. male  PRE-OPERATIVE DIAGNOSIS:  left knee osteoarthritis  POST-OPERATIVE DIAGNOSIS:  left knee osteoarthritis  PROCEDURE:  Procedure(s): TOTAL KNEE ARTHROPLASTY (Left)   Findings the patient had varus knee moderate grade 4 wear on the medial side deficient ACL grade 4 wear on the lateral femoral condyle especially on the medial half of it and then mild patellofemoral disease   Implants DePuy J&J attune 7 femur, 7 tibia, 5 polyethylene insert, 41 patella  Bone cuts Femur 11 Tibia 3 from the medial side reference Patella 25-14  Assisted by Fulton Mole  Preoperative saphenous nerve block   SURGEON:  Surgeon(s) and Role:    [name], MD - Primary   ANESTHESIA: spinal and general   EBL:  50 mL   BLOOD ADMINISTERED:none  DRAINS: none   LOCAL MEDICATIONS USED:  OTHER 10 cc of exparel injected into the quadriceps and patellar tendon and medial soft tissue sleeve  SPECIMEN:  No Specimen  DISPOSITION OF SPECIMEN:  N/A  COUNTS:  YES  TOURNIQUET:   Total Tourniquet Time Documented: Thigh (Left) - 80 minutes Total: Thigh (Left) - 80 minutes   DICTATION: .Viviann Spare Dictation  PLAN OF CARE: Admit to inpatient   PATIENT DISPOSITION:  PACU - hemodynamically stable.   Delay start of Pharmacological VTE agent (>24hrs) due to surgical blood loss or risk of bleeding: yes  Details of the surgery  The patient was seen in the preop area the surgical site was confirmed marked chart review completed x-rays reviewed implants confirmed to be in the building and available  Patient was taken to the operating room for anesthesia where he then had a attempted Foley catheter insertion which was unsuccessful.  We dilated and up to a size 16 and then placed a 18 and then placed 18 catheter.  The urethral mitis was very hard to visualize.  We got back good urine no blood some sediment  We proceeded to prep and drape the  left leg sterilely  After that timeout was completed site was confirmed all preoperative medicines including antibiotics and transxemic acid was given  The limb was exsanguinated with 4 inch Esmarch tourniquet was elevated 3 mmHg  Midline incision was made subcutaneous tissue was divided medial arthrotomy was performed patella was everted laterally knee was placed in flexion anterior medial meniscus and anterior lateral meniscus was excised patella fat pad excised ACL was deficient  PCL was removed with an osteotome and a rondure  Three 8 inch drill bit was used and the femoral canal the canal was suctioned and irrigated  We set the distal femur for a 5 degree left 11 mm cut.  After this cut was made we turned our attention to the tibia  Tibial alignment guide was set for neutral tibial cut in terms of varus valgus we reference the lower medial side with a 3 mm stylus.  We set the slope for 5 degrees  After the guide was set we removed the proximal tibia it measured a size 7.  We then did a extension gap check with a 5 mm insert we obtained balance with a 5 mm insert.  We then turned our attention back to the femur we measured the femur to be a 6-1/2.  We chose a 7 reference point with appropriate external rotation per patient anatomy  We pinned the block and made the 4 distal femoral cuts checking the flexion gap cut with the  5 spacer block prior to cutting it  It was fine and balanced we proceeded with the cuts as stated  We then checked the flexion extension gaps again a 5 mm block gave excellent balance in flexion and extension  We cut the notch with a size 4 guide  We then prepared the proximal tibia  We measured the patella to be 25.5 and then resected down to a 14 we placed a 41 mm button  We did a trial reduction we got excellent flexion with a passive flexion test of 100 degrees and no liftoff up to 125 degrees full extension noted  All trial implants were removed the  knee was irrigated and the bone was dried  Implants were cemented and excess cement was removed second irrigation was performed repeat range of motion test showed good stability in flexion extension excellent flexion obtained 125 degrees  Final irrigation was performed closure was performed with #1 Braylon in running fashion 2 layers of 0 Monocryl staples were applied  Dressing applied with Cryo/Cuff  Patient taken recovery in stable condition

## 2020-07-18 ENCOUNTER — Encounter (HOSPITAL_COMMUNITY): Payer: Self-pay | Admitting: Orthopedic Surgery

## 2020-07-18 LAB — BASIC METABOLIC PANEL
Anion gap: 11 (ref 5–15)
BUN: 16 mg/dL (ref 8–23)
CO2: 22 mmol/L (ref 22–32)
Calcium: 8.3 mg/dL — ABNORMAL LOW (ref 8.9–10.3)
Chloride: 103 mmol/L (ref 98–111)
Creatinine, Ser: 0.73 mg/dL (ref 0.61–1.24)
GFR calc Af Amer: >60 mL/min (ref >60)
GFR calc non Af Amer: >60 mL/min (ref >60)
Glucose, Bld: 120 mg/dL — ABNORMAL HIGH (ref 70–99)
Potassium: 3.6 mmol/L (ref 3.5–5.1)
Sodium: 136 mmol/L (ref 135–145)

## 2020-07-18 LAB — CBC
HCT: 40.5 % (ref 39.0–52.0)
Hemoglobin: 13.8 g/dL (ref 13.0–17.0)
MCH: 30.3 pg (ref 26.0–34.0)
MCHC: 34.1 g/dL (ref 30.0–36.0)
MCV: 88.8 fL (ref 80.0–100.0)
Platelets: 255 10*3/uL (ref 150–400)
RBC: 4.56 MIL/uL (ref 4.22–5.81)
RDW: 12.8 % (ref 11.5–15.5)
WBC: 19.5 10*3/uL — ABNORMAL HIGH (ref 4.0–10.5)
nRBC: 0 % (ref 0.0–0.2)

## 2020-07-18 MED ORDER — POLYETHYLENE GLYCOL 3350 17 G PO PACK: 17.0000 g | PACK | Freq: Every day | ORAL | 0 refills | Status: AC

## 2020-07-18 MED ORDER — BISACODYL 5 MG PO TBEC: 5.0000 mg | DELAYED_RELEASE_TABLET | Freq: Every day | ORAL | 0 refills | Status: AC | PRN

## 2020-07-18 MED ORDER — HYDROCODONE-ACETAMINOPHEN 5-325 MG PO TABS: 1.0000 | ORAL_TABLET | ORAL | 0 refills | Status: DC | PRN

## 2020-07-18 MED ORDER — APIXABAN 2.5 MG PO TABS: 2.5000 mg | ORAL_TABLET | Freq: Two times a day (BID) | ORAL | 0 refills | Status: AC

## 2020-07-18 MED ORDER — DOCUSATE SODIUM 100 MG PO CAPS: 100.0000 mg | ORAL_CAPSULE | Freq: Two times a day (BID) | ORAL | 0 refills | Status: DC

## 2020-07-18 MED ORDER — TRAMADOL HCL 50 MG PO TABS: 50.0000 mg | ORAL_TABLET | Freq: Four times a day (QID) | ORAL | 0 refills | Status: DC

## 2020-07-18 MED ORDER — CELECOXIB 200 MG PO CAPS: 200.0000 mg | ORAL_CAPSULE | Freq: Two times a day (BID) | ORAL | 0 refills | Status: DC

## 2020-07-18 NOTE — Plan of Care (Signed)
  Problem: Education: Goal: Knowledge of General Education information will improve Description: Including pain rating scale, medication(s)/side effects and non-pharmacologic comfort measures 07/18/2020 1149 by Santa Lighter, RN Outcome: Adequate for Discharge 07/18/2020 0826 by Santa Lighter, RN Outcome: Progressing   Problem: Health Behavior/Discharge Planning: Goal: Ability to manage health-related needs will improve 07/18/2020 1149 by Santa Lighter, RN Outcome: Adequate for Discharge 07/18/2020 0826 by Santa Lighter, RN Outcome: Progressing   Problem: Clinical Measurements: Goal: Ability to maintain clinical measurements within normal limits will improve 07/18/2020 1149 by Santa Lighter, RN Outcome: Adequate for Discharge 07/18/2020 0826 by Santa Lighter, RN Outcome: Progressing Goal: Will remain free from infection 07/18/2020 1149 by Santa Lighter, RN Outcome: Adequate for Discharge 07/18/2020 0826 by Santa Lighter, RN Outcome: Progressing Goal: Diagnostic test results will improve Outcome: Adequate for Discharge Goal: Respiratory complications will improve Outcome: Adequate for Discharge Goal: Cardiovascular complication will be avoided Outcome: Adequate for Discharge   Problem: Activity: Goal: Risk for activity intolerance will decrease Outcome: Adequate for Discharge   Problem: Nutrition: Goal: Adequate nutrition will be maintained Outcome: Adequate for Discharge   Problem: Coping: Goal: Level of anxiety will decrease Outcome: Adequate for Discharge   Problem: Elimination: Goal: Will not experience complications related to bowel motility Outcome: Adequate for Discharge Goal: Will not experience complications related to urinary retention Outcome: Adequate for Discharge   Problem: Pain Managment: Goal: General experience of comfort will improve Outcome: Adequate for Discharge   Problem: Safety: Goal: Ability to remain free from injury will  improve Outcome: Adequate for Discharge   Problem: Skin Integrity: Goal: Risk for impaired skin integrity will decrease Outcome: Adequate for Discharge

## 2020-07-18 NOTE — Plan of Care (Signed)

## 2020-07-18 NOTE — Progress Notes (Signed)
Nsg Discharge Note  Admit Date:  07/17/2020 Discharge date: 07/18/2020   Jose Freeman to be D/C'd Home per MD order.  AVS completed.  Copy for chart, and copy for patient signed, and dated. Removed IV-clean, dry, intact. Reviewed d/c paperwork with patient and daughter. Answered all questions. Wheeled stable patient and belongings (ice cooler machine) to main entrance where he was picked up by his daughter to d/c to home. Patient/caregiver able to verbalize understanding.  Discharge Medication: Allergies as of 07/18/2020   No Known Allergies     Medication List    STOP taking these medications   aspirin EC 81 MG tablet   ibuprofen 200 MG tablet Commonly known as: ADVIL   meloxicam 15 MG tablet Commonly known as: MOBIC     TAKE these medications   amLODipine 5 MG tablet Commonly known as: NORVASC Take 5 mg by mouth daily.   apixaban 2.5 MG Tabs tablet Commonly known as: ELIQUIS Take 1 tablet (2.5 mg total) by mouth every 12 (twelve) hours.   bicalutamide 50 MG tablet Commonly known as: CASODEX Take 50 mg by mouth daily.   bisacodyl 5 MG EC tablet Commonly known as: DULCOLAX Take 1 tablet (5 mg total) by mouth daily as needed for moderate constipation.   celecoxib 200 MG capsule Commonly known as: CELEBREX Take 1 capsule (200 mg total) by mouth 2 (two) times daily.   docusate sodium 100 MG capsule Commonly known as: COLACE Take 1 capsule (100 mg total) by mouth 2 (two) times daily.   HYDROcodone-acetaminophen 5-325 MG tablet Commonly known as: NORCO/VICODIN Take 1 tablet by mouth every 4 (four) hours as needed for up to 7 days for moderate pain (pain score 4-6).   lisinopril 10 MG tablet Commonly known as: ZESTRIL Take 10 mg by mouth daily.   polyethylene glycol 17 g packet Commonly known as: MIRALAX / GLYCOLAX Take 17 g by mouth daily.   traMADol 50 MG tablet Commonly known as: ULTRAM Take 1 tablet (50 mg total) by mouth every 6 (six) hours for 7 days.             Durable Medical Equipment  (From admission, onward)         Start     Ordered   07/18/20 1012  For home use only DME Other see comment  Once       Comments: Shower chair  Question:  Length of Need  Answer:  6 Months   07/18/20 1011           Discharge Care Instructions  (From admission, onward)         Start     Ordered   07/18/20 0000  Change dressing       Comments: Do not change dressing   07/18/20 0754          Discharge Assessment: Vitals:   07/17/20 2200 07/18/20 0425  BP: (!) 160/74 (!) 165/77  Pulse: 70 86  Resp: 20 20  Temp: 97.8 F (36.6 C) 97.7 F (36.5 C)  SpO2: 94% 95%   Skin clean, dry and intact without evidence of skin break down, no evidence of skin tears noted. IV catheter discontinued intact. Site without signs and symptoms of complications - no redness or edema noted at insertion site, patient denies c/o pain - only slight tenderness at site.  Dressing with slight pressure applied.  D/c Instructions-Education: Discharge instructions given to patient/family with verbalized understanding. D/c education completed with patient/family including follow up  instructions, medication list, d/c activities limitations if indicated, with other d/c instructions as indicated by MD - patient able to verbalize understanding, all questions fully answered. Patient instructed to return to ED, call 911, or call MD for any changes in condition.  Patient escorted via [place], and D/C home via private auto.  Santa Lighter, RN 07/18/2020 1:38 PM

## 2020-07-18 NOTE — Progress Notes (Signed)
Physical Therapy Treatment Patient Details Name: RONON FERGER MRN: [id number] DOB: February 04, 1934 Today's Date: 07/18/2020   LEFT KNEE ROM:  0 - 100 degrees AMBULATION DISTANCE: 70 feet using RW with Supervision     History of Present Illness DURON MEISTER is a 86 y/o male, s/p Left TKA on 07/17/20 with the diagnosis of left knee osteoarthritis.    PT Comments    Patient demonstrates increased endurance/distance for ambulation with fair/good return for left heel to toe stepping without loss of balance, good tolerance for standing in front of sink to wash hands and achieved increased left knee ROM self stretching using RLE up to 100 degrees left knee flexion while seated at bedside.  Patient tolerated sitting up in chair to eat breakfast after therapy.  Patient will benefit from continued physical therapy in hospital and recommended venue below to increase strength, balance, endurance for safe ADLs and gait.   Follow Up Recommendations  Home health PT;Supervision for mobility/OOB;Supervision - Intermittent     Equipment Recommendations  None recommended by PT    Recommendations for Other Services       Precautions / Restrictions Precautions Precautions: Fall Precaution Comments: s/p Left TKA Restrictions Weight Bearing Restrictions: Yes LLE Weight Bearing: Weight bearing as tolerated    Mobility  Bed Mobility Overal bed mobility: Needs Assistance Bed Mobility: Supine to Sit     Supine to sit: Modified independent (Device/Increase time);Supervision     General bed mobility comments: Patient present seated at bedside  Transfers Overall transfer level: Needs assistance Equipment used: Rolling walker (2 wheeled) Transfers: Sit to/from [employer] Transfers Sit to Stand: Min guard;Min assist Stand pivot transfers: Supervision       General transfer comment: requires verbal/tactile cueing for proper hand placement/body mechanics for completing sit to  stands  Ambulation/Gait Ambulation/Gait assistance: Supervision Gait Distance (Feet): 70 Feet Assistive device: Rolling walker (2 wheeled) Gait Pattern/deviations: Decreased step length - left;Decreased stance time - left;Decreased stride length;Antalgic Gait velocity: decreased   General Gait Details: demonstrates increased endurance/distance for ambulation with improvement for left heel to toe stepping without loss of balance, limited secondary to fatigue   Stairs             Wheelchair Mobility    Modified Rankin (Stroke Patients Only)       Balance Overall balance assessment: Needs assistance Sitting-balance support: Feet supported;No upper extremity supported Sitting balance-Leahy Scale: Good Sitting balance - Comments: seated at EOB   Standing balance support: During functional activity;Bilateral upper extremity supported Standing balance-Leahy Scale: Fair Standing balance comment: fair/good using RW                            Cognition Arousal/Alertness: Awake/alert Behavior During Therapy: WFL for tasks assessed/performed Overall Cognitive Status: Within Functional Limits for tasks assessed                                        Exercises Other Exercises Other Exercises: seated self stretching left knee using RLE x 20-30 second hold    General Comments        Pertinent Vitals/Pain Pain Assessment: Faces Faces Pain Scale: Hurts a little bit Pain Location: left knee Pain Descriptors / Indicators: Sore    Home Living  Prior Function            PT Goals (current goals can now be found in the care plan section) Acute Rehab PT Goals Patient Stated Goal: return home with family to assist PT Goal Formulation: With patient/family Time For Goal Achievement: 07/19/20 Potential to Achieve Goals: Good Progress towards PT goals: Progressing toward goals    Frequency    BID      PT Plan  Current plan remains appropriate    Co-evaluation              AM-PAC PT "6 Clicks" Mobility   Outcome Measure  Help needed turning from your back to your side while in a flat bed without using bedrails?: None Help needed moving from lying on your back to sitting on the side of a flat bed without using bedrails?: None Help needed moving to and from a bed to a chair (including a wheelchair)?: A Little Help needed standing up from a chair using your arms (e.g., wheelchair or bedside chair)?: A Little Help needed to walk in hospital room?: A Little Help needed climbing 3-5 steps with a railing? : A Lot 6 Click Score: 19    End of Session   Activity Tolerance: Patient tolerated treatment well;Patient limited by fatigue Patient left: in chair;with call bell/phone within reach Nurse Communication: Mobility status PT Visit Diagnosis: Unsteadiness on feet (R26.81);Other abnormalities of gait and mobility (R26.89);Muscle weakness (generalized) (M62.81)     Time: 8527-7824 PT Time Calculation (min) (ACUTE ONLY): 24 min  Charges:  $Gait Training: 8-22 mins $Therapeutic Activity: 8-22 mins                     9:14 AM, 07/18/20 Lonell Grandchild, MPT Physical Therapist with [facility] [phone] office [phone] mobile phone

## 2020-07-18 NOTE — TOC Transition Note (Signed)
Transition of Care [facility]) - CM/SW Discharge Note  Patient Details  Name: Jose Freeman MRN: [id number] Date of Birth: 1934/04/29  [facility]) CM/SW Contact:  Sherie Don, LCSW Phone Number: 07/18/2020, 10:34 AM  Clinical Narrative: Patient to be discharged. CSW spoke with Tim at [facility] to notify him of patient's pending discharge. CSW followed up with physician for shower chair DME order. CSW made referral to [facility] with [employer] for shower chair. CSW called daughter to get address for delivery as patient will be staying with her ([address], [place], [place] [address]). TOC signing off.  Final next level of care: [place] Barriers to Discharge: Barriers Resolved  Patient Goals and CMS Choice Patient states their goals for this hospitalization and ongoing recovery are:: Discharge with [facility] CMS Medicare.gov Compare Post Acute Care list provided to:: Patient Represenative (must comment) Choice offered to / list presented to : Patient  Discharge Placement Name of family member notified: Gerlene Fee (daughter)  Discharge Plan and Services In-house Referral: Clinical Social Work Discharge Planning Services: NA Post Acute Care Choice: Home Health          DME Arranged: Shower stool DME Agency: [employer] Date DME Agency Contacted: 07/18/20 Time DME Agency Contacted: 1034 Representative spoke with at DME Agency: Barbaraann Rondo HH Arranged: PT [place]: [facility] (formerly [employer]) Date [place]: 07/18/20 Time [place]: [phone] Representative spoke with at [place]: Tim  Readmission Risk Interventions No flowsheet data found.

## 2020-07-19 DIAGNOSIS — Z96652 Presence of left artificial knee joint: Secondary | ICD-10-CM | POA: Diagnosis not present

## 2020-07-19 DIAGNOSIS — M1712 Unilateral primary osteoarthritis, left knee: Secondary | ICD-10-CM | POA: Diagnosis not present

## 2020-07-20 DIAGNOSIS — Z8546 Personal history of malignant neoplasm of prostate: Secondary | ICD-10-CM | POA: Diagnosis not present

## 2020-07-20 DIAGNOSIS — Z7901 Long term (current) use of anticoagulants: Secondary | ICD-10-CM | POA: Diagnosis not present

## 2020-07-20 DIAGNOSIS — Z471 Aftercare following joint replacement surgery: Secondary | ICD-10-CM | POA: Diagnosis not present

## 2020-07-20 DIAGNOSIS — I10 Essential (primary) hypertension: Secondary | ICD-10-CM | POA: Diagnosis not present

## 2020-07-20 DIAGNOSIS — Z96651 Presence of right artificial knee joint: Secondary | ICD-10-CM | POA: Diagnosis not present

## 2020-07-20 LAB — TYPE AND SCREEN
ABO/RH(D): AB POS
Antibody Screen: NEGATIVE
Unit division: 0
Unit division: 0

## 2020-07-20 LAB — BPAM RBC
Blood Product Expiration Date: 202108012359
Blood Product Expiration Date: 202108012359
Unit Type and Rh: 600
Unit Type and Rh: 600

## 2020-07-21 NOTE — Discharge Summary (Signed)
Physician Discharge Summary  Patient ID: Jose Freeman MRN: [id number] DOB/AGE: 84-Nov-1935 84 y.o.  Admit date: 07/17/2020 Discharge date: 07/18/2020  Admission Diagnoses: Osteoarthritis left knee  Discharge Diagnoses: Osteoarthritis left knee  Discharged Condition: stable  Procedure: Left total knee with attune Telford total knee replacement  Hospital Course:   Mr. Jose Freeman did well except for some immediate postop confusion his pain was well controlled he ambulated well with therapy bend his knee about 90 degrees and there were no complications during the procedure procedure or the postop course  CBC Latest Ref Rng & Units 07/18/2020 07/13/2020  WBC 4.0 - 10.5 K/uL 19.5(H) 9.1  Hemoglobin 13.0 - 17.0 g/dL 13.8 15.3  Hematocrit 39 - 52 % 40.5 44.1  Platelets 150 - 400 K/uL 255 296   BMP Latest Ref Rng & Units 07/18/2020 07/13/2020 05/07/2015  Glucose 70 - 99 mg/dL 120(H) 158(H) -  BUN 8 - 23 mg/dL 16 15 -  Creatinine 0.61 - 1.24 mg/dL 0.73 0.72 0.90  Sodium 135 - 145 mmol/L 136 137 -  Potassium 3.5 - 5.1 mmol/L 3.6 3.6 -  Chloride 98 - 111 mmol/L 103 106 -  CO2 22 - 32 mmol/L 22 22 -  Calcium 8.9 - 10.3 mg/dL 8.3(L) 8.8(L) -     Discharge Exam: BP (!) 165/77 (BP Location: Left Arm)   Pulse 86   Temp 97.7 F (36.5 C)   Resp 20   SpO2 95%  Physical Exam    Disposition: Discharge disposition: 01-Home or Self Care       Discharge Instructions    CPM   Complete by: As directed    Continuous passive motion machine (CPM):      Use the CPM from 0 to 75 for 4 hours per day.      You may increase by 10 per day.  You may break it up into 2 or 3 sessions per day.      Use CPM for 2 weeks or until you are told to stop.   Call MD / Call 911   Complete by: As directed    If you experience chest pain or shortness of breath, CALL 911 and be transported to the hospital emergency room.  If you develope a fever above 101 F, pus (white drainage) or increased drainage or  redness at the wound, or calf pain, call your surgeon's office.   Change dressing   Complete by: As directed    Do not change dressing   Constipation Prevention   Complete by: As directed    Drink plenty of fluids.  Prune juice may be helpful.  You may use a stool softener, such as Colace (over the counter) 100 mg twice a day.  Use MiraLax (over the counter) for constipation as needed.   Diet - low sodium heart healthy   Complete by: As directed    Discharge instructions   Complete by: As directed    Blue pillow under heel 30 min 3 x a day   Driving restrictions   Complete by: As directed    No driving for 3 weeks   Increase activity slowly as tolerated   Complete by: As directed    TED hose   Complete by: As directed    Use stockings (TED hose) for 2 weeks on both leg(s).  You may remove them at night for sleeping.     Allergies as of 07/18/2020   No Known Allergies     Medication  List    STOP taking these medications   aspirin EC 81 MG tablet   ibuprofen 200 MG tablet Commonly known as: ADVIL   meloxicam 15 MG tablet Commonly known as: MOBIC     TAKE these medications   amLODipine 5 MG tablet Commonly known as: NORVASC Take 5 mg by mouth daily.   apixaban 2.5 MG Tabs tablet Commonly known as: ELIQUIS Take 1 tablet (2.5 mg total) by mouth every 12 (twelve) hours.   bicalutamide 50 MG tablet Commonly known as: CASODEX Take 50 mg by mouth daily.   bisacodyl 5 MG EC tablet Commonly known as: DULCOLAX Take 1 tablet (5 mg total) by mouth daily as needed for moderate constipation.   celecoxib 200 MG capsule Commonly known as: CELEBREX Take 1 capsule (200 mg total) by mouth 2 (two) times daily.   docusate sodium 100 MG capsule Commonly known as: COLACE Take 1 capsule (100 mg total) by mouth 2 (two) times daily.   HYDROcodone-acetaminophen 5-325 MG tablet Commonly known as: NORCO/VICODIN Take 1 tablet by mouth every 4 (four) hours as needed for up to 7 days  for moderate pain (pain score 4-6).   lisinopril 10 MG tablet Commonly known as: ZESTRIL Take 10 mg by mouth daily.   polyethylene glycol 17 g packet Commonly known as: MIRALAX / GLYCOLAX Take 17 g by mouth daily.   traMADol 50 MG tablet Commonly known as: ULTRAM Take 1 tablet (50 mg total) by mouth every 6 (six) hours for 7 days.            Discharge Care Instructions  (From admission, onward)         Start     Ordered   07/18/20 0000  Change dressing       Comments: Do not change dressing   07/18/20 0754          Follow-up Information    [name], MD Follow up on 07/31/2020.   Specialties: Orthopedic Surgery, Radiology Contact information: [address] [place] [address] [phone]               Signed: Arther Abbott 07/21/2020, 9:20 AM

## 2020-07-23 ENCOUNTER — Telehealth: Payer: Self-pay | Admitting: Radiology

## 2020-07-23 ENCOUNTER — Telehealth: Payer: Self-pay | Admitting: Orthopedic Surgery

## 2020-07-23 DIAGNOSIS — Z7901 Long term (current) use of anticoagulants: Secondary | ICD-10-CM | POA: Diagnosis not present

## 2020-07-23 DIAGNOSIS — Z471 Aftercare following joint replacement surgery: Secondary | ICD-10-CM | POA: Diagnosis not present

## 2020-07-23 DIAGNOSIS — I10 Essential (primary) hypertension: Secondary | ICD-10-CM | POA: Diagnosis not present

## 2020-07-23 DIAGNOSIS — Z8546 Personal history of malignant neoplasm of prostate: Secondary | ICD-10-CM | POA: Diagnosis not present

## 2020-07-23 DIAGNOSIS — Z96651 Presence of right artificial knee joint: Secondary | ICD-10-CM | POA: Diagnosis not present

## 2020-07-23 NOTE — Telephone Encounter (Signed)
Patient's daughter called, and wanted to clarify how many times daily patient needed to use the CPM.  Rep said use QID, she now saw d/c papers said BID.  Patient has been using it QID. Please call to advise?

## 2020-07-23 NOTE — Telephone Encounter (Signed)
Jose Freeman, Pt from [facility] asks for you to call her to get verbal orders of this patient.  Jose Freeman's phone number is [phone]  Thanks 

## 2020-07-23 NOTE — Telephone Encounter (Signed)
qid

## 2020-07-23 NOTE — Telephone Encounter (Signed)
Called with verbal orders.  

## 2020-07-23 NOTE — Telephone Encounter (Signed)
CPM bid or qid?

## 2020-07-23 NOTE — Telephone Encounter (Signed)
Called to advise. She voiced understanding

## 2020-07-24 ENCOUNTER — Encounter: Payer: Self-pay | Admitting: Orthopedic Surgery

## 2020-07-24 ENCOUNTER — Other Ambulatory Visit: Payer: Self-pay

## 2020-07-24 ENCOUNTER — Ambulatory Visit (INDEPENDENT_AMBULATORY_CARE_PROVIDER_SITE_OTHER): Payer: PPO | Admitting: Orthopedic Surgery

## 2020-07-24 DIAGNOSIS — Z96652 Presence of left artificial knee joint: Secondary | ICD-10-CM

## 2020-07-24 MED ORDER — TRAMADOL HCL 50 MG PO TABS: 50.0000 mg | ORAL_TABLET | Freq: Four times a day (QID) | ORAL | 0 refills | Status: DC

## 2020-07-24 MED ORDER — HYDROCODONE-ACETAMINOPHEN 5-325 MG PO TABS: 1.0000 | ORAL_TABLET | ORAL | 0 refills | Status: DC | PRN

## 2020-07-24 NOTE — Addendum Note (Signed)
Addended byCandice Camp on: 07/24/2020 02:52 PM   Modules accepted: Orders

## 2020-07-24 NOTE — Progress Notes (Signed)
Patient ID: Jose Freeman, male   DOB: 10-21-1934, 86 y.o.   MRN: [id number]  Chief Complaint  Patient presents with  . Routine Post Op    total knee replacement left 07/10/20     HPI Jose Freeman is a 86 y.o. male.  I dont have a pain in the world    No Known Allergies  Current Outpatient Medications  Medication Sig Dispense Refill  . amLODipine (NORVASC) 5 MG tablet Take 5 mg by mouth daily.    Marland Kitchen apixaban (ELIQUIS) 2.5 MG TABS tablet Take 1 tablet (2.5 mg total) by mouth every 12 (twelve) hours. 60 tablet 0  . bicalutamide (CASODEX) 50 MG tablet Take 50 mg by mouth daily.    . bisacodyl (DULCOLAX) 5 MG EC tablet Take 1 tablet (5 mg total) by mouth daily as needed for moderate constipation. 30 tablet 0  . celecoxib (CELEBREX) 200 MG capsule Take 1 capsule (200 mg total) by mouth 2 (two) times daily. 60 capsule 0  . docusate sodium (COLACE) 100 MG capsule Take 1 capsule (100 mg total) by mouth 2 (two) times daily. 10 capsule 0  . HYDROcodone-acetaminophen (NORCO/VICODIN) 5-325 MG tablet Take 1 tablet by mouth every 4 (four) hours as needed for up to 7 days for moderate pain (pain score 4-6). 28 tablet 0  . lisinopril (ZESTRIL) 10 MG tablet Take 10 mg by mouth daily.    . polyethylene glycol (MIRALAX / GLYCOLAX) 17 g packet Take 17 g by mouth daily. 14 each 0  . traMADol (ULTRAM) 50 MG tablet Take 1 tablet (50 mg total) by mouth every 6 (six) hours for 7 days. 28 tablet 0   No current facility-administered medications for this visit.      Physical Exam Physical Exam There were no vitals taken for this visit.  Appearance of incision: Incision is clean dry and intact we removed the staples  The calf was supple and the Homans sign was normal, there is minimal peripheral edema, I do see a lot of ecchymosis  Assessment and plan The patient is doing well and is in good condition Change eliquis to asa 81 mg daily   Start out px PT  rom 7 - 90  Follow-up will be 3 weeks  2:43  PM Arther Abbott, MD 07/24/2020

## 2020-07-24 NOTE — Patient Instructions (Signed)
Take tramadol for pain and hydrocodone for breakthru pain

## 2020-07-30 ENCOUNTER — Other Ambulatory Visit: Payer: Self-pay | Admitting: Orthopedic Surgery

## 2020-07-30 ENCOUNTER — Telehealth: Payer: Self-pay | Admitting: Orthopedic Surgery

## 2020-07-30 DIAGNOSIS — Z96652 Presence of left artificial knee joint: Secondary | ICD-10-CM

## 2020-07-30 DIAGNOSIS — M25562 Pain in left knee: Secondary | ICD-10-CM | POA: Diagnosis not present

## 2020-07-30 MED ORDER — HYDROCODONE-ACETAMINOPHEN 5-325 MG PO TABS: 1.0000 | ORAL_TABLET | Freq: Every day | ORAL | 0 refills | Status: AC

## 2020-07-30 MED ORDER — TRAMADOL HCL 50 MG PO TABS: 50.0000 mg | ORAL_TABLET | Freq: Four times a day (QID) | ORAL | 0 refills | Status: AC

## 2020-07-30 NOTE — Telephone Encounter (Signed)
Ususally its 4 weeks if on aspirin

## 2020-07-30 NOTE — Telephone Encounter (Signed)
TKR 07/10/20 when can he d/c hose?

## 2020-07-30 NOTE — Telephone Encounter (Signed)
Patient question, per designated contact on file, Sharee Pimple - asking if patient is keep the compression hose on for his physical therapy, which is beginning today, 07/27/20, 3:00pm, at [facility]

## 2020-07-30 NOTE — Telephone Encounter (Signed)
You had him d/c the Eliquis last visit, so yes, he is on Aspirin.  I called to advise.   He needs refill Hydrocodone and Tramadol

## 2020-08-03 DIAGNOSIS — M25562 Pain in left knee: Secondary | ICD-10-CM | POA: Diagnosis not present

## 2020-08-06 ENCOUNTER — Telehealth: Payer: Self-pay | Admitting: Orthopedic Surgery

## 2020-08-06 DIAGNOSIS — M25562 Pain in left knee: Secondary | ICD-10-CM | POA: Diagnosis not present

## 2020-08-06 NOTE — Telephone Encounter (Signed)
I spoke to [place] patient is great Still had questions about bandage, ok to d/c Also about hose. Ok to d/c end of the week

## 2020-08-06 NOTE — Telephone Encounter (Signed)
Corey Harold, PT from [facility] in [place] asks that someone give him a call regarding this patient.  The phone number is [phone]  Thanks

## 2020-08-07 ENCOUNTER — Telehealth: Payer: Self-pay | Admitting: Orthopedic Surgery

## 2020-08-07 NOTE — Telephone Encounter (Signed)
Call received from patient/designated party daughter Gerlene Fee  [phone] (said may leave a voice message with response) - asking if okay that physical therapist at [facility], removed his bandage, and did not place it back on. Also said he may discontinue the hose.  Please advise.

## 2020-08-07 NOTE — Telephone Encounter (Signed)
Yes, ok to d/c the bandage but needs the TED hose x 4 weeks

## 2020-08-08 DIAGNOSIS — M25562 Pain in left knee: Secondary | ICD-10-CM | POA: Diagnosis not present

## 2020-08-14 ENCOUNTER — Ambulatory Visit (INDEPENDENT_AMBULATORY_CARE_PROVIDER_SITE_OTHER): Payer: PPO | Admitting: Orthopedic Surgery

## 2020-08-14 ENCOUNTER — Other Ambulatory Visit: Payer: Self-pay

## 2020-08-14 ENCOUNTER — Encounter: Payer: Self-pay | Admitting: Orthopedic Surgery

## 2020-08-14 VITALS — Ht 69.0 in | Wt 194.0 lb

## 2020-08-14 DIAGNOSIS — Z96652 Presence of left artificial knee joint: Secondary | ICD-10-CM

## 2020-08-14 NOTE — Progress Notes (Signed)
Chief Complaint  Patient presents with  . Routine Post Op    DOS 07/17/20   28 days post op   Left total knee  Patient is on Eliquis he can stop that in 7 days  Therapy at [facility]  He can stop using his CPM  Continue therapy and follow-up in 4 weeks  Encounter Diagnosis  Name Primary?  . S/P total knee replacement, left 07/17/20 Yes

## 2020-08-15 ENCOUNTER — Other Ambulatory Visit: Payer: Self-pay | Admitting: Orthopedic Surgery

## 2020-08-15 DIAGNOSIS — M25562 Pain in left knee: Secondary | ICD-10-CM | POA: Diagnosis not present

## 2020-08-16 NOTE — Telephone Encounter (Signed)
Dr Brooke Bonito patient- as he is out of office, can you advise?

## 2020-08-17 DIAGNOSIS — M25562 Pain in left knee: Secondary | ICD-10-CM | POA: Diagnosis not present

## 2020-08-20 DIAGNOSIS — M25562 Pain in left knee: Secondary | ICD-10-CM | POA: Diagnosis not present

## 2020-08-23 DIAGNOSIS — M25562 Pain in left knee: Secondary | ICD-10-CM | POA: Diagnosis not present

## 2020-08-28 DIAGNOSIS — M25562 Pain in left knee: Secondary | ICD-10-CM | POA: Diagnosis not present

## 2020-08-30 DIAGNOSIS — M25562 Pain in left knee: Secondary | ICD-10-CM | POA: Diagnosis not present

## 2020-09-03 DIAGNOSIS — M25562 Pain in left knee: Secondary | ICD-10-CM | POA: Diagnosis not present

## 2020-09-05 DIAGNOSIS — M25562 Pain in left knee: Secondary | ICD-10-CM | POA: Diagnosis not present

## 2020-09-12 ENCOUNTER — Ambulatory Visit (INDEPENDENT_AMBULATORY_CARE_PROVIDER_SITE_OTHER): Payer: PPO | Admitting: Orthopedic Surgery

## 2020-09-12 ENCOUNTER — Other Ambulatory Visit: Payer: Self-pay

## 2020-09-12 DIAGNOSIS — Z96652 Presence of left artificial knee joint: Secondary | ICD-10-CM

## 2020-09-12 NOTE — Patient Instructions (Signed)
Stop eliquis   Stop outpx therapy   Continue home therapy

## 2020-09-12 NOTE — Progress Notes (Signed)
Chief Complaint  Patient presents with  . Knee Pain    s/p total knee replacement, 07/10/20.     Encounter Diagnosis  Name Primary?  . S/P total knee replacement, left 07/17/20 Yes    Current Outpatient Medications  Medication Instructions  . amLODipine (NORVASC) 5 mg, Oral, Daily  . apixaban (ELIQUIS) 2.5 mg, Oral, Every 12 hours  . bicalutamide (CASODEX) 50 mg, Oral, Daily  . bisacodyl (DULCOLAX) 5 mg, Oral, Daily PRN  . celecoxib (CELEBREX) 200 MG capsule TAKE ONE CAPSULE BY MOUTH TWICE DAILY  . lisinopril (ZESTRIL) 10 mg, Oral, Daily  . polyethylene glycol (MIRALAX / GLYCOLAX) 17 g, Oral, Daily    Doing well post op tka left knee  Stop eliquis   Stop outpx therapy   Continue home therapy   F/U 3 months   Jose Freeman is 2 months after left total knee is doing well.  We can stop his Eliquis.  He is regained his range of motion 0 to 115 degrees.  His knee looks good his incision looks great he has good quadricep strength he is ambulating pretty well with a cane  He says the therapy started to bother his back so he can stop that let him do a home program and see [place] in 3 months

## 2020-09-20 DIAGNOSIS — R32 Unspecified urinary incontinence: Secondary | ICD-10-CM | POA: Diagnosis not present

## 2020-09-20 DIAGNOSIS — I10 Essential (primary) hypertension: Secondary | ICD-10-CM | POA: Diagnosis not present

## 2020-10-04 ENCOUNTER — Other Ambulatory Visit: Payer: Self-pay

## 2020-10-04 DIAGNOSIS — C61 Malignant neoplasm of prostate: Secondary | ICD-10-CM

## 2020-10-04 MED ORDER — BICALUTAMIDE 50 MG PO TABS: 50.0000 mg | ORAL_TABLET | Freq: Every day | ORAL | 3 refills | Status: DC

## 2020-10-17 DIAGNOSIS — Z7189 Other specified counseling: Secondary | ICD-10-CM | POA: Diagnosis not present

## 2020-10-17 DIAGNOSIS — Z2821 Immunization not carried out because of patient refusal: Secondary | ICD-10-CM | POA: Diagnosis not present

## 2020-10-17 DIAGNOSIS — Z6828 Body mass index (BMI) 28.0-28.9, adult: Secondary | ICD-10-CM | POA: Diagnosis not present

## 2020-10-17 DIAGNOSIS — Z0000 Encounter for general adult medical examination without abnormal findings: Secondary | ICD-10-CM | POA: Diagnosis not present

## 2020-10-17 DIAGNOSIS — Z1339 Encounter for screening examination for other mental health and behavioral disorders: Secondary | ICD-10-CM | POA: Diagnosis not present

## 2020-10-17 DIAGNOSIS — I10 Essential (primary) hypertension: Secondary | ICD-10-CM | POA: Diagnosis not present

## 2020-10-17 DIAGNOSIS — Z1331 Encounter for screening for depression: Secondary | ICD-10-CM | POA: Diagnosis not present

## 2020-10-17 DIAGNOSIS — Z299 Encounter for prophylactic measures, unspecified: Secondary | ICD-10-CM | POA: Diagnosis not present

## 2020-10-18 DIAGNOSIS — R5383 Other fatigue: Secondary | ICD-10-CM | POA: Diagnosis not present

## 2020-10-18 DIAGNOSIS — Z79899 Other long term (current) drug therapy: Secondary | ICD-10-CM | POA: Diagnosis not present

## 2020-10-18 DIAGNOSIS — Z125 Encounter for screening for malignant neoplasm of prostate: Secondary | ICD-10-CM | POA: Diagnosis not present

## 2020-10-18 DIAGNOSIS — E7800 Pure hypercholesterolemia, unspecified: Secondary | ICD-10-CM | POA: Diagnosis not present

## 2020-10-19 DIAGNOSIS — R32 Unspecified urinary incontinence: Secondary | ICD-10-CM | POA: Diagnosis not present

## 2020-10-19 DIAGNOSIS — I10 Essential (primary) hypertension: Secondary | ICD-10-CM | POA: Diagnosis not present

## 2020-10-31 DIAGNOSIS — R413 Other amnesia: Secondary | ICD-10-CM | POA: Diagnosis not present

## 2020-11-29 DIAGNOSIS — I10 Essential (primary) hypertension: Secondary | ICD-10-CM | POA: Diagnosis not present

## 2020-11-29 DIAGNOSIS — Z299 Encounter for prophylactic measures, unspecified: Secondary | ICD-10-CM | POA: Diagnosis not present

## 2020-11-29 DIAGNOSIS — Z2821 Immunization not carried out because of patient refusal: Secondary | ICD-10-CM | POA: Diagnosis not present

## 2020-11-29 DIAGNOSIS — R413 Other amnesia: Secondary | ICD-10-CM | POA: Diagnosis not present

## 2020-12-12 ENCOUNTER — Other Ambulatory Visit: Payer: Self-pay

## 2020-12-12 ENCOUNTER — Encounter: Payer: Self-pay | Admitting: Orthopedic Surgery

## 2020-12-12 ENCOUNTER — Ambulatory Visit (INDEPENDENT_AMBULATORY_CARE_PROVIDER_SITE_OTHER): Payer: PPO | Admitting: Orthopedic Surgery

## 2020-12-12 VITALS — BP 128/76 | HR 69 | Ht 69.0 in | Wt 194.0 lb

## 2020-12-12 DIAGNOSIS — Z96652 Presence of left artificial knee joint: Secondary | ICD-10-CM | POA: Diagnosis not present

## 2020-12-12 DIAGNOSIS — M1712 Unilateral primary osteoarthritis, left knee: Secondary | ICD-10-CM | POA: Diagnosis not present

## 2020-12-12 NOTE — Progress Notes (Signed)
FOLLOW-UP OFFICE VISIT   Encounter Diagnoses  Name Primary?  . S/P total knee replacement, left 07/10/20 Yes  . Unilateral primary osteoarthritis, left knee     86 year old male routine follow-up 5 months postop left total knee doing well just complains of some soreness  He does have to use his cane but he is happy with his knee  (and prior treatment)  + EXAM FINDINGS: His incision healed nicely the knee looks good he has a 5degree extension deficit he flexes to 115 knee feels stable  ASSESSMENT AND PLAN Recommend he follow-up for his annual 1 year checkup otherwise doing well

## 2020-12-21 DIAGNOSIS — I10 Essential (primary) hypertension: Secondary | ICD-10-CM | POA: Diagnosis not present

## 2020-12-21 DIAGNOSIS — R32 Unspecified urinary incontinence: Secondary | ICD-10-CM | POA: Diagnosis not present

## 2021-01-21 DIAGNOSIS — R32 Unspecified urinary incontinence: Secondary | ICD-10-CM | POA: Diagnosis not present

## 2021-01-21 DIAGNOSIS — I10 Essential (primary) hypertension: Secondary | ICD-10-CM | POA: Diagnosis not present

## 2021-02-01 DIAGNOSIS — R350 Frequency of micturition: Secondary | ICD-10-CM | POA: Diagnosis not present

## 2021-02-01 DIAGNOSIS — Z6828 Body mass index (BMI) 28.0-28.9, adult: Secondary | ICD-10-CM | POA: Diagnosis not present

## 2021-02-01 DIAGNOSIS — I10 Essential (primary) hypertension: Secondary | ICD-10-CM | POA: Diagnosis not present

## 2021-02-01 DIAGNOSIS — Z299 Encounter for prophylactic measures, unspecified: Secondary | ICD-10-CM | POA: Diagnosis not present

## 2021-02-01 DIAGNOSIS — Z87891 Personal history of nicotine dependence: Secondary | ICD-10-CM | POA: Diagnosis not present

## 2021-03-08 ENCOUNTER — Encounter: Payer: Self-pay | Admitting: Orthopedic Surgery

## 2021-03-20 DIAGNOSIS — I10 Essential (primary) hypertension: Secondary | ICD-10-CM | POA: Diagnosis not present

## 2021-03-20 DIAGNOSIS — I129 Hypertensive chronic kidney disease with stage 1 through stage 4 chronic kidney disease, or unspecified chronic kidney disease: Secondary | ICD-10-CM | POA: Diagnosis not present

## 2021-06-18 DIAGNOSIS — R5383 Other fatigue: Secondary | ICD-10-CM | POA: Diagnosis not present

## 2021-06-18 DIAGNOSIS — I10 Essential (primary) hypertension: Secondary | ICD-10-CM | POA: Diagnosis not present

## 2021-06-18 DIAGNOSIS — Z299 Encounter for prophylactic measures, unspecified: Secondary | ICD-10-CM | POA: Diagnosis not present

## 2021-06-20 DIAGNOSIS — E7849 Other hyperlipidemia: Secondary | ICD-10-CM | POA: Diagnosis not present

## 2021-06-20 DIAGNOSIS — I129 Hypertensive chronic kidney disease with stage 1 through stage 4 chronic kidney disease, or unspecified chronic kidney disease: Secondary | ICD-10-CM | POA: Diagnosis not present

## 2021-06-20 DIAGNOSIS — N183 Chronic kidney disease, stage 3 unspecified: Secondary | ICD-10-CM | POA: Diagnosis not present

## 2021-06-25 DIAGNOSIS — Z299 Encounter for prophylactic measures, unspecified: Secondary | ICD-10-CM | POA: Diagnosis not present

## 2021-06-25 DIAGNOSIS — I10 Essential (primary) hypertension: Secondary | ICD-10-CM | POA: Diagnosis not present

## 2021-06-25 DIAGNOSIS — R531 Weakness: Secondary | ICD-10-CM | POA: Diagnosis not present

## 2021-06-25 DIAGNOSIS — Z87891 Personal history of nicotine dependence: Secondary | ICD-10-CM | POA: Diagnosis not present

## 2021-07-09 ENCOUNTER — Ambulatory Visit: Payer: PPO | Admitting: Urology

## 2021-07-11 ENCOUNTER — Ambulatory Visit: Payer: PPO | Admitting: Orthopedic Surgery

## 2021-07-18 ENCOUNTER — Ambulatory Visit: Payer: PPO | Admitting: Orthopedic Surgery

## 2021-09-20 DIAGNOSIS — E7800 Pure hypercholesterolemia, unspecified: Secondary | ICD-10-CM | POA: Diagnosis not present

## 2021-09-20 DIAGNOSIS — M109 Gout, unspecified: Secondary | ICD-10-CM | POA: Diagnosis not present

## 2021-09-21 ENCOUNTER — Other Ambulatory Visit: Payer: Self-pay | Admitting: Urology

## 2021-09-21 DIAGNOSIS — C61 Malignant neoplasm of prostate: Secondary | ICD-10-CM

## 2021-11-05 DIAGNOSIS — W19XXXA Unspecified fall, initial encounter: Secondary | ICD-10-CM | POA: Diagnosis not present

## 2021-11-05 DIAGNOSIS — R42 Dizziness and giddiness: Secondary | ICD-10-CM | POA: Diagnosis not present

## 2021-11-05 DIAGNOSIS — D329 Benign neoplasm of meninges, unspecified: Secondary | ICD-10-CM | POA: Diagnosis not present

## 2021-11-05 DIAGNOSIS — R531 Weakness: Secondary | ICD-10-CM | POA: Diagnosis not present

## 2021-11-05 DIAGNOSIS — R0902 Hypoxemia: Secondary | ICD-10-CM | POA: Diagnosis not present

## 2021-11-05 DIAGNOSIS — Z20822 Contact with and (suspected) exposure to covid-19: Secondary | ICD-10-CM | POA: Diagnosis not present

## 2021-11-05 DIAGNOSIS — J9811 Atelectasis: Secondary | ICD-10-CM | POA: Diagnosis not present

## 2021-11-05 DIAGNOSIS — R0689 Other abnormalities of breathing: Secondary | ICD-10-CM | POA: Diagnosis not present

## 2021-11-05 DIAGNOSIS — I10 Essential (primary) hypertension: Secondary | ICD-10-CM | POA: Diagnosis not present

## 2021-11-05 DIAGNOSIS — G9389 Other specified disorders of brain: Secondary | ICD-10-CM | POA: Diagnosis not present

## 2021-11-06 DIAGNOSIS — D329 Benign neoplasm of meninges, unspecified: Secondary | ICD-10-CM | POA: Diagnosis not present

## 2021-11-06 DIAGNOSIS — R42 Dizziness and giddiness: Secondary | ICD-10-CM | POA: Diagnosis not present

## 2021-11-06 DIAGNOSIS — R531 Weakness: Secondary | ICD-10-CM | POA: Diagnosis not present

## 2021-11-07 DIAGNOSIS — R41841 Cognitive communication deficit: Secondary | ICD-10-CM | POA: Diagnosis not present

## 2021-11-07 DIAGNOSIS — E785 Hyperlipidemia, unspecified: Secondary | ICD-10-CM | POA: Diagnosis not present

## 2021-11-07 DIAGNOSIS — G319 Degenerative disease of nervous system, unspecified: Secondary | ICD-10-CM | POA: Diagnosis not present

## 2021-11-07 DIAGNOSIS — R1312 Dysphagia, oropharyngeal phase: Secondary | ICD-10-CM | POA: Diagnosis not present

## 2021-11-07 DIAGNOSIS — C801 Malignant (primary) neoplasm, unspecified: Secondary | ICD-10-CM | POA: Diagnosis not present

## 2021-11-07 DIAGNOSIS — E7800 Pure hypercholesterolemia, unspecified: Secondary | ICD-10-CM | POA: Diagnosis not present

## 2021-11-07 DIAGNOSIS — S0990XA Unspecified injury of head, initial encounter: Secondary | ICD-10-CM | POA: Diagnosis not present

## 2021-11-07 DIAGNOSIS — I6522 Occlusion and stenosis of left carotid artery: Secondary | ICD-10-CM | POA: Diagnosis not present

## 2021-11-07 DIAGNOSIS — R296 Repeated falls: Secondary | ICD-10-CM | POA: Diagnosis not present

## 2021-11-07 DIAGNOSIS — Z7401 Bed confinement status: Secondary | ICD-10-CM | POA: Diagnosis not present

## 2021-11-07 DIAGNOSIS — M6281 Muscle weakness (generalized): Secondary | ICD-10-CM | POA: Diagnosis not present

## 2021-11-07 DIAGNOSIS — S0003XA Contusion of scalp, initial encounter: Secondary | ICD-10-CM | POA: Diagnosis not present

## 2021-11-07 DIAGNOSIS — C61 Malignant neoplasm of prostate: Secondary | ICD-10-CM | POA: Diagnosis not present

## 2021-11-07 DIAGNOSIS — R42 Dizziness and giddiness: Secondary | ICD-10-CM | POA: Diagnosis not present

## 2021-11-07 DIAGNOSIS — R262 Difficulty in walking, not elsewhere classified: Secondary | ICD-10-CM | POA: Diagnosis not present

## 2021-11-07 DIAGNOSIS — G4489 Other headache syndrome: Secondary | ICD-10-CM | POA: Diagnosis not present

## 2021-11-07 DIAGNOSIS — Z859 Personal history of malignant neoplasm, unspecified: Secondary | ICD-10-CM | POA: Diagnosis not present

## 2021-11-07 DIAGNOSIS — R0902 Hypoxemia: Secondary | ICD-10-CM | POA: Diagnosis not present

## 2021-11-07 DIAGNOSIS — Z7982 Long term (current) use of aspirin: Secondary | ICD-10-CM | POA: Diagnosis not present

## 2021-11-07 DIAGNOSIS — I6501 Occlusion and stenosis of right vertebral artery: Secondary | ICD-10-CM | POA: Diagnosis not present

## 2021-11-07 DIAGNOSIS — E869 Volume depletion, unspecified: Secondary | ICD-10-CM | POA: Diagnosis not present

## 2021-11-07 DIAGNOSIS — I951 Orthostatic hypotension: Secondary | ICD-10-CM | POA: Diagnosis not present

## 2021-11-07 DIAGNOSIS — D329 Benign neoplasm of meninges, unspecified: Secondary | ICD-10-CM | POA: Diagnosis not present

## 2021-11-07 DIAGNOSIS — E46 Unspecified protein-calorie malnutrition: Secondary | ICD-10-CM | POA: Diagnosis not present

## 2021-11-07 DIAGNOSIS — J9811 Atelectasis: Secondary | ICD-10-CM | POA: Diagnosis not present

## 2021-11-07 DIAGNOSIS — R4020 Unspecified coma: Secondary | ICD-10-CM | POA: Diagnosis not present

## 2021-11-07 DIAGNOSIS — I700 Atherosclerosis of aorta: Secondary | ICD-10-CM | POA: Diagnosis not present

## 2021-11-07 DIAGNOSIS — R4182 Altered mental status, unspecified: Secondary | ICD-10-CM | POA: Diagnosis not present

## 2021-11-07 DIAGNOSIS — I10 Essential (primary) hypertension: Secondary | ICD-10-CM | POA: Diagnosis not present

## 2021-11-07 DIAGNOSIS — S199XXA Unspecified injury of neck, initial encounter: Secondary | ICD-10-CM | POA: Diagnosis not present

## 2021-11-07 DIAGNOSIS — R55 Syncope and collapse: Secondary | ICD-10-CM | POA: Diagnosis not present

## 2021-11-07 DIAGNOSIS — R279 Unspecified lack of coordination: Secondary | ICD-10-CM | POA: Diagnosis not present

## 2021-11-07 DIAGNOSIS — W19XXXA Unspecified fall, initial encounter: Secondary | ICD-10-CM | POA: Diagnosis not present

## 2021-11-07 DIAGNOSIS — R531 Weakness: Secondary | ICD-10-CM | POA: Diagnosis not present

## 2021-11-07 DIAGNOSIS — Z20822 Contact with and (suspected) exposure to covid-19: Secondary | ICD-10-CM | POA: Diagnosis not present

## 2021-11-13 DIAGNOSIS — R41841 Cognitive communication deficit: Secondary | ICD-10-CM | POA: Diagnosis not present

## 2021-11-13 DIAGNOSIS — E46 Unspecified protein-calorie malnutrition: Secondary | ICD-10-CM | POA: Diagnosis not present

## 2021-11-13 DIAGNOSIS — M109 Gout, unspecified: Secondary | ICD-10-CM | POA: Diagnosis not present

## 2021-11-13 DIAGNOSIS — E785 Hyperlipidemia, unspecified: Secondary | ICD-10-CM | POA: Diagnosis not present

## 2021-11-13 DIAGNOSIS — R1312 Dysphagia, oropharyngeal phase: Secondary | ICD-10-CM | POA: Diagnosis not present

## 2021-11-13 DIAGNOSIS — Z7401 Bed confinement status: Secondary | ICD-10-CM | POA: Diagnosis not present

## 2021-11-13 DIAGNOSIS — R55 Syncope and collapse: Secondary | ICD-10-CM | POA: Diagnosis not present

## 2021-11-13 DIAGNOSIS — W19XXXA Unspecified fall, initial encounter: Secondary | ICD-10-CM | POA: Diagnosis not present

## 2021-11-13 DIAGNOSIS — R262 Difficulty in walking, not elsewhere classified: Secondary | ICD-10-CM | POA: Diagnosis not present

## 2021-11-13 DIAGNOSIS — M6281 Muscle weakness (generalized): Secondary | ICD-10-CM | POA: Diagnosis not present

## 2021-11-13 DIAGNOSIS — C801 Malignant (primary) neoplasm, unspecified: Secondary | ICD-10-CM | POA: Diagnosis not present

## 2021-11-13 DIAGNOSIS — I700 Atherosclerosis of aorta: Secondary | ICD-10-CM | POA: Diagnosis not present

## 2021-11-13 DIAGNOSIS — R531 Weakness: Secondary | ICD-10-CM | POA: Diagnosis not present

## 2021-11-13 DIAGNOSIS — I951 Orthostatic hypotension: Secondary | ICD-10-CM | POA: Diagnosis not present

## 2021-11-13 DIAGNOSIS — R059 Cough, unspecified: Secondary | ICD-10-CM | POA: Diagnosis not present

## 2021-11-13 DIAGNOSIS — C61 Malignant neoplasm of prostate: Secondary | ICD-10-CM | POA: Diagnosis not present

## 2021-11-13 DIAGNOSIS — D329 Benign neoplasm of meninges, unspecified: Secondary | ICD-10-CM | POA: Diagnosis not present

## 2021-11-13 DIAGNOSIS — R279 Unspecified lack of coordination: Secondary | ICD-10-CM | POA: Diagnosis not present

## 2021-11-13 DIAGNOSIS — S0003XA Contusion of scalp, initial encounter: Secondary | ICD-10-CM | POA: Diagnosis not present

## 2021-11-13 DIAGNOSIS — E7800 Pure hypercholesterolemia, unspecified: Secondary | ICD-10-CM | POA: Diagnosis not present

## 2021-11-13 DIAGNOSIS — I10 Essential (primary) hypertension: Secondary | ICD-10-CM | POA: Diagnosis not present

## 2021-11-13 DIAGNOSIS — S0990XA Unspecified injury of head, initial encounter: Secondary | ICD-10-CM | POA: Diagnosis not present

## 2021-11-13 DIAGNOSIS — R42 Dizziness and giddiness: Secondary | ICD-10-CM | POA: Diagnosis not present

## 2021-11-20 DIAGNOSIS — R059 Cough, unspecified: Secondary | ICD-10-CM | POA: Diagnosis not present

## 2021-11-20 DIAGNOSIS — M109 Gout, unspecified: Secondary | ICD-10-CM | POA: Diagnosis not present

## 2021-11-20 DIAGNOSIS — E7800 Pure hypercholesterolemia, unspecified: Secondary | ICD-10-CM | POA: Diagnosis not present

## 2021-11-26 DIAGNOSIS — I10 Essential (primary) hypertension: Secondary | ICD-10-CM | POA: Diagnosis not present

## 2021-11-30 DIAGNOSIS — I10 Essential (primary) hypertension: Secondary | ICD-10-CM | POA: Diagnosis not present

## 2021-11-30 DIAGNOSIS — C61 Malignant neoplasm of prostate: Secondary | ICD-10-CM | POA: Diagnosis not present

## 2021-11-30 DIAGNOSIS — I951 Orthostatic hypotension: Secondary | ICD-10-CM | POA: Diagnosis not present

## 2021-11-30 DIAGNOSIS — R42 Dizziness and giddiness: Secondary | ICD-10-CM | POA: Diagnosis not present

## 2021-11-30 DIAGNOSIS — D329 Benign neoplasm of meninges, unspecified: Secondary | ICD-10-CM | POA: Diagnosis not present

## 2021-12-05 DIAGNOSIS — C61 Malignant neoplasm of prostate: Secondary | ICD-10-CM | POA: Diagnosis not present

## 2021-12-05 DIAGNOSIS — I10 Essential (primary) hypertension: Secondary | ICD-10-CM | POA: Diagnosis not present

## 2021-12-05 DIAGNOSIS — Z7982 Long term (current) use of aspirin: Secondary | ICD-10-CM | POA: Diagnosis not present

## 2021-12-05 DIAGNOSIS — Z9181 History of falling: Secondary | ICD-10-CM | POA: Diagnosis not present

## 2021-12-05 DIAGNOSIS — M1990 Unspecified osteoarthritis, unspecified site: Secondary | ICD-10-CM | POA: Diagnosis not present

## 2021-12-05 DIAGNOSIS — D329 Benign neoplasm of meninges, unspecified: Secondary | ICD-10-CM | POA: Diagnosis not present

## 2021-12-05 DIAGNOSIS — Z79891 Long term (current) use of opiate analgesic: Secondary | ICD-10-CM | POA: Diagnosis not present

## 2021-12-05 DIAGNOSIS — E7800 Pure hypercholesterolemia, unspecified: Secondary | ICD-10-CM | POA: Diagnosis not present

## 2021-12-06 DIAGNOSIS — Z6829 Body mass index (BMI) 29.0-29.9, adult: Secondary | ICD-10-CM | POA: Diagnosis not present

## 2021-12-06 DIAGNOSIS — R059 Cough, unspecified: Secondary | ICD-10-CM | POA: Diagnosis not present

## 2021-12-06 DIAGNOSIS — Z299 Encounter for prophylactic measures, unspecified: Secondary | ICD-10-CM | POA: Diagnosis not present

## 2021-12-06 DIAGNOSIS — R5383 Other fatigue: Secondary | ICD-10-CM | POA: Diagnosis not present

## 2021-12-06 DIAGNOSIS — R42 Dizziness and giddiness: Secondary | ICD-10-CM | POA: Diagnosis not present

## 2021-12-06 DIAGNOSIS — J449 Chronic obstructive pulmonary disease, unspecified: Secondary | ICD-10-CM | POA: Diagnosis not present

## 2021-12-06 DIAGNOSIS — Z2821 Immunization not carried out because of patient refusal: Secondary | ICD-10-CM | POA: Diagnosis not present

## 2021-12-12 DIAGNOSIS — Z79891 Long term (current) use of opiate analgesic: Secondary | ICD-10-CM | POA: Diagnosis not present

## 2021-12-12 DIAGNOSIS — D329 Benign neoplasm of meninges, unspecified: Secondary | ICD-10-CM | POA: Diagnosis not present

## 2021-12-12 DIAGNOSIS — Z9181 History of falling: Secondary | ICD-10-CM | POA: Diagnosis not present

## 2021-12-12 DIAGNOSIS — I10 Essential (primary) hypertension: Secondary | ICD-10-CM | POA: Diagnosis not present

## 2021-12-12 DIAGNOSIS — C61 Malignant neoplasm of prostate: Secondary | ICD-10-CM | POA: Diagnosis not present

## 2021-12-12 DIAGNOSIS — M1990 Unspecified osteoarthritis, unspecified site: Secondary | ICD-10-CM | POA: Diagnosis not present

## 2021-12-12 DIAGNOSIS — Z7982 Long term (current) use of aspirin: Secondary | ICD-10-CM | POA: Diagnosis not present

## 2021-12-12 DIAGNOSIS — E7800 Pure hypercholesterolemia, unspecified: Secondary | ICD-10-CM | POA: Diagnosis not present

## 2022-01-31 ENCOUNTER — Other Ambulatory Visit (HOSPITAL_COMMUNITY): Payer: Self-pay | Admitting: Neurosurgery

## 2022-01-31 ENCOUNTER — Other Ambulatory Visit: Payer: Self-pay | Admitting: Neurosurgery

## 2022-01-31 DIAGNOSIS — D320 Benign neoplasm of cerebral meninges: Secondary | ICD-10-CM

## 2022-01-31 DIAGNOSIS — I10 Essential (primary) hypertension: Secondary | ICD-10-CM | POA: Diagnosis not present

## 2022-06-03 DIAGNOSIS — Z87891 Personal history of nicotine dependence: Secondary | ICD-10-CM | POA: Diagnosis not present

## 2022-06-03 DIAGNOSIS — I10 Essential (primary) hypertension: Secondary | ICD-10-CM | POA: Diagnosis not present

## 2022-06-03 DIAGNOSIS — J449 Chronic obstructive pulmonary disease, unspecified: Secondary | ICD-10-CM | POA: Diagnosis not present

## 2022-06-03 DIAGNOSIS — R413 Other amnesia: Secondary | ICD-10-CM | POA: Diagnosis not present

## 2022-06-20 DIAGNOSIS — N183 Chronic kidney disease, stage 3 unspecified: Secondary | ICD-10-CM | POA: Diagnosis not present

## 2022-06-20 DIAGNOSIS — I129 Hypertensive chronic kidney disease with stage 1 through stage 4 chronic kidney disease, or unspecified chronic kidney disease: Secondary | ICD-10-CM | POA: Diagnosis not present

## 2022-06-20 DIAGNOSIS — E7849 Other hyperlipidemia: Secondary | ICD-10-CM | POA: Diagnosis not present

## 2022-07-24 DIAGNOSIS — R531 Weakness: Secondary | ICD-10-CM | POA: Diagnosis not present

## 2022-07-24 DIAGNOSIS — J449 Chronic obstructive pulmonary disease, unspecified: Secondary | ICD-10-CM | POA: Diagnosis not present

## 2022-07-24 DIAGNOSIS — I10 Essential (primary) hypertension: Secondary | ICD-10-CM | POA: Diagnosis not present

## 2022-07-24 DIAGNOSIS — I951 Orthostatic hypotension: Secondary | ICD-10-CM | POA: Diagnosis not present

## 2022-07-24 DIAGNOSIS — Z299 Encounter for prophylactic measures, unspecified: Secondary | ICD-10-CM | POA: Diagnosis not present

## 2023-04-02 DIAGNOSIS — E7800 Pure hypercholesterolemia, unspecified: Secondary | ICD-10-CM | POA: Diagnosis not present

## 2023-04-02 DIAGNOSIS — I10 Essential (primary) hypertension: Secondary | ICD-10-CM | POA: Diagnosis not present

## 2023-04-02 DIAGNOSIS — Z7189 Other specified counseling: Secondary | ICD-10-CM | POA: Diagnosis not present

## 2023-04-02 DIAGNOSIS — Z0000 Encounter for general adult medical examination without abnormal findings: Secondary | ICD-10-CM | POA: Diagnosis not present

## 2023-04-02 DIAGNOSIS — R5383 Other fatigue: Secondary | ICD-10-CM | POA: Diagnosis not present

## 2023-04-02 DIAGNOSIS — Z299 Encounter for prophylactic measures, unspecified: Secondary | ICD-10-CM | POA: Diagnosis not present

## 2023-04-02 DIAGNOSIS — D329 Benign neoplasm of meninges, unspecified: Secondary | ICD-10-CM | POA: Diagnosis not present

## 2023-04-02 DIAGNOSIS — Z79899 Other long term (current) drug therapy: Secondary | ICD-10-CM | POA: Diagnosis not present

## 2023-04-02 DIAGNOSIS — J449 Chronic obstructive pulmonary disease, unspecified: Secondary | ICD-10-CM | POA: Diagnosis not present

## 2023-07-02 DIAGNOSIS — I10 Essential (primary) hypertension: Secondary | ICD-10-CM | POA: Diagnosis not present

## 2023-07-02 DIAGNOSIS — Z299 Encounter for prophylactic measures, unspecified: Secondary | ICD-10-CM | POA: Diagnosis not present

## 2023-07-02 DIAGNOSIS — G309 Alzheimer's disease, unspecified: Secondary | ICD-10-CM | POA: Diagnosis not present

## 2023-07-02 DIAGNOSIS — R350 Frequency of micturition: Secondary | ICD-10-CM | POA: Diagnosis not present

## 2023-07-03 DIAGNOSIS — E7800 Pure hypercholesterolemia, unspecified: Secondary | ICD-10-CM | POA: Diagnosis not present

## 2023-07-03 DIAGNOSIS — I10 Essential (primary) hypertension: Secondary | ICD-10-CM | POA: Diagnosis not present

## 2023-07-03 DIAGNOSIS — K5730 Diverticulosis of large intestine without perforation or abscess without bleeding: Secondary | ICD-10-CM | POA: Diagnosis not present

## 2023-07-03 DIAGNOSIS — R1032 Left lower quadrant pain: Secondary | ICD-10-CM | POA: Diagnosis not present

## 2023-07-03 DIAGNOSIS — K5792 Diverticulitis of intestine, part unspecified, without perforation or abscess without bleeding: Secondary | ICD-10-CM | POA: Diagnosis not present

## 2023-07-03 DIAGNOSIS — Z7982 Long term (current) use of aspirin: Secondary | ICD-10-CM | POA: Diagnosis not present

## 2023-07-03 DIAGNOSIS — N200 Calculus of kidney: Secondary | ICD-10-CM | POA: Diagnosis not present

## 2023-07-03 DIAGNOSIS — Z87891 Personal history of nicotine dependence: Secondary | ICD-10-CM | POA: Diagnosis not present

## 2023-07-03 DIAGNOSIS — R109 Unspecified abdominal pain: Secondary | ICD-10-CM | POA: Diagnosis not present

## 2023-07-03 DIAGNOSIS — R1084 Generalized abdominal pain: Secondary | ICD-10-CM | POA: Diagnosis not present

## 2023-07-14 DIAGNOSIS — J449 Chronic obstructive pulmonary disease, unspecified: Secondary | ICD-10-CM | POA: Diagnosis not present

## 2023-07-14 DIAGNOSIS — K5792 Diverticulitis of intestine, part unspecified, without perforation or abscess without bleeding: Secondary | ICD-10-CM | POA: Diagnosis not present

## 2023-07-14 DIAGNOSIS — Z299 Encounter for prophylactic measures, unspecified: Secondary | ICD-10-CM | POA: Diagnosis not present

## 2023-07-14 DIAGNOSIS — I10 Essential (primary) hypertension: Secondary | ICD-10-CM | POA: Diagnosis not present

## 2023-07-14 DIAGNOSIS — G309 Alzheimer's disease, unspecified: Secondary | ICD-10-CM | POA: Diagnosis not present

## 2023-08-12 DIAGNOSIS — R5383 Other fatigue: Secondary | ICD-10-CM | POA: Diagnosis not present

## 2023-08-12 DIAGNOSIS — Z299 Encounter for prophylactic measures, unspecified: Secondary | ICD-10-CM | POA: Diagnosis not present

## 2023-08-12 DIAGNOSIS — G309 Alzheimer's disease, unspecified: Secondary | ICD-10-CM | POA: Diagnosis not present

## 2023-08-12 DIAGNOSIS — I10 Essential (primary) hypertension: Secondary | ICD-10-CM | POA: Diagnosis not present

## 2023-08-12 DIAGNOSIS — R0602 Shortness of breath: Secondary | ICD-10-CM | POA: Diagnosis not present

## 2023-10-08 DIAGNOSIS — Z299 Encounter for prophylactic measures, unspecified: Secondary | ICD-10-CM | POA: Diagnosis not present

## 2023-10-08 DIAGNOSIS — R350 Frequency of micturition: Secondary | ICD-10-CM | POA: Diagnosis not present

## 2024-06-29 DIAGNOSIS — Z299 Encounter for prophylactic measures, unspecified: Secondary | ICD-10-CM | POA: Diagnosis not present

## 2024-06-29 DIAGNOSIS — D329 Benign neoplasm of meninges, unspecified: Secondary | ICD-10-CM | POA: Diagnosis not present

## 2024-06-29 DIAGNOSIS — R413 Other amnesia: Secondary | ICD-10-CM | POA: Diagnosis not present

## 2024-06-29 DIAGNOSIS — I10 Essential (primary) hypertension: Secondary | ICD-10-CM | POA: Diagnosis not present

## 2024-06-29 DIAGNOSIS — J449 Chronic obstructive pulmonary disease, unspecified: Secondary | ICD-10-CM | POA: Diagnosis not present

## 2024-10-03 DIAGNOSIS — I10 Essential (primary) hypertension: Secondary | ICD-10-CM | POA: Diagnosis not present

## 2024-10-03 DIAGNOSIS — Z7189 Other specified counseling: Secondary | ICD-10-CM | POA: Diagnosis not present

## 2024-10-03 DIAGNOSIS — Z299 Encounter for prophylactic measures, unspecified: Secondary | ICD-10-CM | POA: Diagnosis not present

## 2024-10-03 DIAGNOSIS — Z1389 Encounter for screening for other disorder: Secondary | ICD-10-CM | POA: Diagnosis not present

## 2024-10-03 DIAGNOSIS — Z0000 Encounter for general adult medical examination without abnormal findings: Secondary | ICD-10-CM | POA: Diagnosis not present
